# Patient Record
Sex: Female | Born: 1960 | Race: White | Hispanic: No | Marital: Married | Smoking: Never smoker
Health system: Southern US, Community
[De-identification: ages and names within clinical notes are randomized; demographics above are authoritative.]

## PROBLEM LIST (undated history)

## (undated) DIAGNOSIS — G4730 Sleep apnea, unspecified: Secondary | ICD-10-CM

## (undated) DIAGNOSIS — I10 Essential (primary) hypertension: Secondary | ICD-10-CM

## (undated) DIAGNOSIS — K5792 Diverticulitis of intestine, part unspecified, without perforation or abscess without bleeding: Secondary | ICD-10-CM

## (undated) DIAGNOSIS — E119 Type 2 diabetes mellitus without complications: Secondary | ICD-10-CM

## (undated) HISTORY — DX: Diverticulitis of intestine, part unspecified, without perforation or abscess without bleeding: K57.92

## (undated) HISTORY — PX: VAGINAL HYSTERECTOMY: SUR661

## (undated) HISTORY — DX: Sleep apnea, unspecified: G47.30

## (undated) HISTORY — DX: Essential (primary) hypertension: I10

---

## 2007-11-04 ENCOUNTER — Emergency Department: Payer: Self-pay | Admitting: Emergency Medicine

## 2008-10-22 HISTORY — PX: CARDIAC CATHETERIZATION: SHX172

## 2009-04-21 ENCOUNTER — Ambulatory Visit: Payer: Self-pay | Admitting: Family Medicine

## 2009-05-19 ENCOUNTER — Ambulatory Visit: Payer: Self-pay | Admitting: Cardiology

## 2009-11-12 ENCOUNTER — Emergency Department: Payer: Self-pay | Admitting: Emergency Medicine

## 2010-02-21 ENCOUNTER — Inpatient Hospital Stay: Payer: Self-pay | Admitting: Gastroenterology

## 2010-02-21 ENCOUNTER — Ambulatory Visit: Payer: Self-pay | Admitting: Gastroenterology

## 2010-06-19 ENCOUNTER — Emergency Department: Payer: Self-pay | Admitting: Emergency Medicine

## 2011-07-22 ENCOUNTER — Emergency Department: Payer: Self-pay | Admitting: Emergency Medicine

## 2012-07-08 ENCOUNTER — Emergency Department: Payer: Self-pay | Admitting: Emergency Medicine

## 2012-07-08 LAB — CBC
HCT: 40.5 % (ref 35.0–47.0)
HGB: 13.5 g/dL (ref 12.0–16.0)
MCHC: 33.2 g/dL (ref 32.0–36.0)
RBC: 4.48 10*6/uL (ref 3.80–5.20)
RDW: 13.2 % (ref 11.5–14.5)
WBC: 7.9 10*3/uL (ref 3.6–11.0)

## 2012-07-08 LAB — URINALYSIS, COMPLETE
Bacteria: NONE SEEN
Blood: NEGATIVE
Glucose,UR: NEGATIVE mg/dL (ref 0–75)
Ketone: NEGATIVE
Leukocyte Esterase: NEGATIVE
Nitrite: NEGATIVE

## 2012-07-08 LAB — DRUG SCREEN, URINE
Amphetamines, Ur Screen: NEGATIVE (ref ?–1000)
Barbiturates, Ur Screen: NEGATIVE (ref ?–200)
Cannabinoid 50 Ng, Ur ~~LOC~~: NEGATIVE (ref ?–50)
Cocaine Metabolite,Ur ~~LOC~~: NEGATIVE (ref ?–300)
MDMA (Ecstasy)Ur Screen: NEGATIVE (ref ?–500)
Opiate, Ur Screen: NEGATIVE (ref ?–300)
Phencyclidine (PCP) Ur S: NEGATIVE (ref ?–25)
Tricyclic, Ur Screen: NEGATIVE (ref ?–1000)

## 2012-07-08 LAB — COMPREHENSIVE METABOLIC PANEL
Albumin: 3.6 g/dL (ref 3.4–5.0)
Alkaline Phosphatase: 71 U/L (ref 50–136)
Anion Gap: 11 (ref 7–16)
BUN: 10 mg/dL (ref 7–18)
Calcium, Total: 8.8 mg/dL (ref 8.5–10.1)
EGFR (Non-African Amer.): >60
Glucose: 127 mg/dL — ABNORMAL HIGH (ref 65–99)
Osmolality: 284 (ref 275–301)
Potassium: 3.6 mmol/L (ref 3.5–5.1)
SGOT(AST): 20 U/L (ref 15–37)
SGPT (ALT): 30 U/L (ref 12–78)

## 2012-07-08 LAB — CK TOTAL AND CKMB (NOT AT ARMC): CK-MB: 2.1 ng/mL (ref 0.5–3.6)

## 2012-07-08 LAB — TROPONIN I: Troponin-I: <0.02 ng/mL

## 2012-07-08 LAB — TSH: Thyroid Stimulating Horm: 1.6 u[IU]/mL

## 2012-07-18 ENCOUNTER — Ambulatory Visit (INDEPENDENT_AMBULATORY_CARE_PROVIDER_SITE_OTHER): Payer: PRIVATE HEALTH INSURANCE | Admitting: Cardiovascular Disease

## 2012-07-18 ENCOUNTER — Encounter: Payer: Self-pay | Admitting: Cardiovascular Disease

## 2012-07-18 VITALS — BP 138/90 | HR 80 | Ht 63.0 in | Wt 206.8 lb

## 2012-07-18 DIAGNOSIS — R002 Palpitations: Secondary | ICD-10-CM | POA: Insufficient documentation

## 2012-07-18 DIAGNOSIS — R079 Chest pain, unspecified: Secondary | ICD-10-CM | POA: Insufficient documentation

## 2012-07-18 DIAGNOSIS — R000 Tachycardia, unspecified: Secondary | ICD-10-CM | POA: Insufficient documentation

## 2012-07-18 MED ORDER — ATENOLOL 100 MG PO TABS: 100.0000 mg | ORAL_TABLET | Freq: Two times a day (BID) | ORAL | Status: DC

## 2012-07-18 NOTE — Assessment & Plan Note (Signed)
Most of her symptoms seem to relate around her tachycardia at nighttime. Previous cardiac catheterization showing no significant CAD. No further workup at this time.

## 2012-07-18 NOTE — Assessment & Plan Note (Signed)
Suspect she has atrial tachycardia but only at nighttime. Unable to exclude other arrhythmia. We have suggested she start taking atenolol 50 mg in the evening in addition to her morning dose. We will slowly advance the evening dose as tolerated. If unable to improve her symptoms, we will do a Holter monitor at her request.

## 2012-07-18 NOTE — Patient Instructions (Addendum)
  Please increase the atenolol  Add extra 50 mg in the PM If you continue to have symptoms at night, take 50 mg in the AM and 100 mg in the PM You can increase the dose up to 100 mg twice a day  Please call us if you have new issues that need to be addressed before your next appt.  Your physician wants you to follow-up in: 1 months.

## 2012-07-18 NOTE — Assessment & Plan Note (Signed)
Uncertain if she has having ectopy, atrial tachycardia or other rhythm. We'll try medical management first. She denies any new stressors.

## 2012-07-18 NOTE — Progress Notes (Signed)
   Patient ID: April Estrada, female    DOB: [birth year], 51 y.o.   MRN: [id number]  HPI Comments: April Estrada is a very pleasant 51 year old woman with prior history of chest pain, abnormal stress test, cardiac catheterization showing no significant coronary artery disease in 2010 who has been maintained on atenolol for many years for blood pressure presents for evaluation of tachycardia and palpitations predominantly at nighttime.  She reports that her symptoms started one to 2 years ago. Symptoms every several times per week, predominantly at nighttime lasting less than 5 minutes at a time. She is woken up with a very strong fast heart rate. She typically does not do anything and it resolves on its own. Symptoms have been getting worse recently. Less during the daytime although this can happen, predominately at nighttime. She takes atenolol 100 mg in the morning. She denies any new stressors, any new medications. No other change in her health.  Cardiac catheterization 05/19/2009 shows normal coronary arteries. 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  She was seen in the emergency room for chest pain. EKG at that time showed left axis deviation, normal sinus rhythm with no other significant EKG changes. Chest x-ray was normal, cardiac enzymes normal, all other lab work normal  EKG today shows normal sinus rhythm with rate 80 beats per minute with left anterior fascicular block   Outpatient Encounter Prescriptions as of 07/18/2012  Medication Sig Dispense Refill  . Probiotic Product (PHILLIPS COLON HEALTH PO) Take by mouth daily.      Marland Kitchen atenolol (TENORMIN) 100 MG tablet Take 100 mg by mouth daily.        Review of Systems  Constitutional: Negative.   HENT: Negative.   Eyes: Negative.   Respiratory: Negative.   Cardiovascular: Positive for chest pain and palpitations.  Gastrointestinal: Negative.   Musculoskeletal: Negative.   Skin: Negative.   Neurological: Negative.     Hematological: Negative.   Psychiatric/Behavioral: Negative.   All other systems reviewed and are negative.    BP 138/90  Pulse 80  Ht 5\' 3"  (1.6 m)  Wt 206 lb 12 oz (93.781 kg)  BMI 36.62 kg/m2  Physical Exam  Nursing note and vitals reviewed. Constitutional: She is oriented to person, place, and time. She appears well-developed and well-nourished.  HENT:  Head: Normocephalic.  Nose: Nose normal.  Mouth/Throat: Oropharynx is clear and moist.  Eyes: Conjunctivae normal are normal. Pupils are equal, round, and reactive to light.  Neck: Normal range of motion. Neck supple. No JVD present.  Cardiovascular: Normal rate, regular rhythm, S1 normal, S2 normal and intact distal pulses.  Exam reveals no gallop and no friction rub.   Murmur heard.  Crescendo systolic murmur is present with a grade of 1/6  Pulmonary/Chest: Effort normal and breath sounds normal. No respiratory distress. She has no wheezes. She has no rales. She exhibits no tenderness.  Abdominal: Soft. Bowel sounds are normal. She exhibits no distension. There is no tenderness.  Musculoskeletal: Normal range of motion. She exhibits no edema and no tenderness.  Lymphadenopathy:    She has no cervical adenopathy.  Neurological: She is alert and oriented to person, place, and time. Coordination normal.  Skin: Skin is warm and dry. No rash noted. No erythema.  Psychiatric: She has a normal mood and affect. Her behavior is normal. Judgment and thought content normal.         Assessment and Plan

## 2012-08-27 ENCOUNTER — Encounter: Payer: Self-pay | Admitting: Cardiovascular Disease

## 2012-08-27 ENCOUNTER — Ambulatory Visit (INDEPENDENT_AMBULATORY_CARE_PROVIDER_SITE_OTHER): Payer: PRIVATE HEALTH INSURANCE | Admitting: Cardiovascular Disease

## 2012-08-27 VITALS — BP 120/80 | HR 69 | Ht 63.0 in | Wt 210.0 lb

## 2012-08-27 DIAGNOSIS — R079 Chest pain, unspecified: Secondary | ICD-10-CM

## 2012-08-27 DIAGNOSIS — R0602 Shortness of breath: Secondary | ICD-10-CM

## 2012-08-27 DIAGNOSIS — R000 Tachycardia, unspecified: Secondary | ICD-10-CM

## 2012-08-27 DIAGNOSIS — R609 Edema, unspecified: Secondary | ICD-10-CM

## 2012-08-27 DIAGNOSIS — E669 Obesity, unspecified: Secondary | ICD-10-CM | POA: Insufficient documentation

## 2012-08-27 MED ORDER — FUROSEMIDE 20 MG PO TABS: 20.0000 mg | ORAL_TABLET | Freq: Every day | ORAL | Status: DC

## 2012-08-27 MED ORDER — POTASSIUM CHLORIDE CRYS ER 20 MEQ PO TBCR: 20.0000 meq | EXTENDED_RELEASE_TABLET | Freq: Every day | ORAL | Status: DC

## 2012-08-27 NOTE — Progress Notes (Signed)
   Patient ID: April Estrada, female    DOB: [birth year], 51 y.o.   MRN: [id number]  HPI Comments: April Estrada is a very pleasant 51 year old woman with prior history of chest pain, abnormal stress test, cardiac catheterization showing no significant coronary artery disease in 2010 who has been maintained on atenolol for many years, seen on her last clinic visit for tachycardia and palpitations predominantly at nighttime. April Estrada presents for routine followup.  April Estrada increased her atenolol to 100 mg twice a day and this has improved her symptoms of tachycardia and palpitations. April Estrada is trying to lose weight, watching what April Estrada eats and walking more but still has not lost any weight. Some mild lower extremity edema. April Estrada has been drinking more fluids. Edema is worse at the end of the day. No improvement by wearing compression hose.  Cardiac catheterization 05/19/2009 shows normal coronary arteries. 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  Previously  seen in the emergency room for chest pain. EKG at that time showed left axis deviation, normal sinus rhythm with no other significant EKG changes. Chest x-ray was normal, cardiac enzymes normal, all other lab work normal  EKG today shows normal sinus rhythm with rate 69 beats per minute with left anterior fascicular block   Outpatient Encounter Prescriptions as of 08/27/2012  Medication Sig Dispense Refill  . atenolol (TENORMIN) 100 MG tablet Take 1 tablet (100 mg total) by mouth 2 (two) times daily.  180 tablet  3  . Probiotic Product (PHILLIPS COLON HEALTH PO) Take by mouth daily.        Review of Systems  Constitutional: Negative.   HENT: Negative.   Eyes: Negative.   Respiratory: Negative.   Cardiovascular: Positive for leg swelling.  Gastrointestinal: Negative.   Musculoskeletal: Negative.   Skin: Negative.   Neurological: Negative.   Hematological: Negative.   Psychiatric/Behavioral: Negative.   All other systems reviewed and are  negative.    BP 120/80  Pulse 69  Ht 5\' 3"  (1.6 m)  Wt 210 lb (95.255 kg)  BMI 37.20 kg/m2  Physical Exam  Nursing note and vitals reviewed. Constitutional: April Estrada is oriented to person, place, and time. April Estrada appears well-developed and well-nourished.  HENT:  Head: Normocephalic.  Nose: Nose normal.  Mouth/Throat: Oropharynx is clear and moist.  Eyes: Conjunctivae normal are normal. Pupils are equal, round, and reactive to light.  Neck: Normal range of motion. Neck supple. No JVD present.  Cardiovascular: Normal rate, regular rhythm, S1 normal, S2 normal and intact distal pulses.  Exam reveals no gallop and no friction rub.   Murmur heard.  Crescendo systolic murmur is present with a grade of 1/6  Pulmonary/Chest: Effort normal and breath sounds normal. No respiratory distress. April Estrada has no wheezes. April Estrada has no rales. April Estrada exhibits no tenderness.  Abdominal: Soft. Bowel sounds are normal. April Estrada exhibits no distension. There is no tenderness.  Musculoskeletal: Normal range of motion. April Estrada exhibits no edema and no tenderness.  Lymphadenopathy:    April Estrada has no cervical adenopathy.  Neurological: April Estrada is alert and oriented to person, place, and time. Coordination normal.  Skin: Skin is warm and dry. No rash noted. No erythema.  Psychiatric: April Estrada has a normal mood and affect. Her behavior is normal. Judgment and thought content normal.         Assessment and Plan

## 2012-08-27 NOTE — Patient Instructions (Addendum)
You are doing well. Please try lasix with potassium as needed for leg swelling  Please call us if you have new issues that need to be addressed before your next appt.  Your physician wants you to follow-up in: 12 months.  You will receive a reminder letter in the mail two months in advance. If you don't receive a letter, please call our office to schedule the follow-up appointment.

## 2012-08-27 NOTE — Assessment & Plan Note (Signed)
She is closely monitoring her diet, increasing her exercise. We have encouraged her to continue her current activities.

## 2012-08-27 NOTE — Assessment & Plan Note (Signed)
Minimal edema on exam. She is concerned about the severity of symptoms at the end of the day. Likely secondary to venous insufficiency, though unable to exclude mild diastolic dysfunction and fluid retention as she does trend a significant amount of fluids. We have offered Lasix with potassium when necessary. We have suggested if she takes this frequently, that she contact our office for lab work.

## 2012-08-27 NOTE — Assessment & Plan Note (Signed)
Palpitations and tachycardia has improved on atenolol 100 mg twice a day

## 2013-01-28 ENCOUNTER — Other Ambulatory Visit: Payer: Self-pay | Admitting: *Deleted

## 2013-01-28 MED ORDER — FUROSEMIDE 20 MG PO TABS: 20.0000 mg | ORAL_TABLET | Freq: Every day | ORAL | Status: DC

## 2013-01-28 MED ORDER — POTASSIUM CHLORIDE CRYS ER 20 MEQ PO TBCR: 20.0000 meq | EXTENDED_RELEASE_TABLET | Freq: Every day | ORAL | Status: DC

## 2013-01-28 NOTE — Telephone Encounter (Signed)
Refilled Furosemide and Potassium sent to [facility].

## 2013-03-23 ENCOUNTER — Emergency Department: Payer: Self-pay | Admitting: Emergency Medicine

## 2013-07-06 ENCOUNTER — Other Ambulatory Visit: Payer: Self-pay | Admitting: Cardiovascular Disease

## 2013-07-06 NOTE — Telephone Encounter (Signed)
Refilled Atenolol sent to [facility].

## 2013-09-02 ENCOUNTER — Encounter: Payer: Self-pay | Admitting: Cardiovascular Disease

## 2013-09-02 ENCOUNTER — Ambulatory Visit (INDEPENDENT_AMBULATORY_CARE_PROVIDER_SITE_OTHER): Payer: Commercial Managed Care - PPO | Admitting: Cardiovascular Disease

## 2013-09-02 VITALS — BP 120/80 | HR 83 | Ht 63.0 in | Wt 210.5 lb

## 2013-09-02 DIAGNOSIS — R000 Tachycardia, unspecified: Secondary | ICD-10-CM

## 2013-09-02 DIAGNOSIS — E669 Obesity, unspecified: Secondary | ICD-10-CM

## 2013-09-02 DIAGNOSIS — I498 Other specified cardiac arrhythmias: Secondary | ICD-10-CM

## 2013-09-02 DIAGNOSIS — I4902 Ventricular flutter: Secondary | ICD-10-CM

## 2013-09-02 MED ORDER — PROPRANOLOL HCL 20 MG PO TABS: 20.0000 mg | ORAL_TABLET | Freq: Three times a day (TID) | ORAL | Status: DC | PRN

## 2013-09-02 NOTE — Progress Notes (Signed)
Patient ID: April Estrada, female    DOB: May 02, 1961, 52 y.o.   MRN: [id number]  HPI Comments: [name] is a very pleasant 52 year old woman with prior history of chest pain, abnormal stress test, cardiac catheterization showing no significant coronary artery disease in 2010 who has been maintained on atenolol for many years, seen on previous clinic visits for tachycardia and palpitations predominantly at nighttime. She presents for routine followup. She is a CNA  She reports that she has had significant stress recently. Her husband has lost his job. She continues to have episodes of tachycardia at nighttime lasting for 15 minutes. He feels strong, fast. Roughly 4 times per week it will wake her. 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 She sleeps from 9 AM to 3 PM and a short nap before work she continues to take the Atenolol 100 mg twice a day . No significant symptoms of fatigue or dizziness . No significant edema on today's visit . She has reported large hemi-swelling in the past .  No improvement by wearing compression hose.  Cardiac catheterization 05/19/2009 shows normal coronary arteries. 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  Previously  seen in the emergency room for chest pain. EKG at that time showed left axis deviation, normal sinus rhythm with no other significant EKG changes. Chest x-ray was normal, cardiac enzymes normal, all other lab work normal  EKG today shows normal sinus rhythm with rate 83 beats per minute with left anterior fascicular block   Outpatient Encounter Prescriptions as of 09/02/2013  Medication Sig  . atenolol (TENORMIN) 100 MG tablet TAKE 1 TABLET (100 MG TOTAL) BY MOUTH 2 (TWO) TIMES DAILY.  . furosemide (LASIX) 20 MG tablet Take 1 tablet (20 mg total) by mouth daily.  . potassium chloride SA (K-DUR,KLOR-CON) 20 MEQ tablet Take 1 tablet (20 mEq total) by mouth daily.  . Probiotic Product (PHILLIPS COLON HEALTH PO) Take by mouth  daily.    Review of Systems  Constitutional: Negative.   HENT: Negative.   Eyes: Negative.   Respiratory: Negative.   Cardiovascular: Positive for palpitations.  Gastrointestinal: Negative.   Endocrine: Negative.   Musculoskeletal: Negative.   Skin: Negative.   Allergic/Immunologic: Negative.   Neurological: Negative.   Hematological: Negative.   Psychiatric/Behavioral: Negative.   All other systems reviewed and are negative.    BP 120/80  Pulse 83  Ht 5\' 3"  (1.6 m)  Wt 210 lb 8 oz (95.482 kg)  BMI 37.30 kg/m2  Physical Exam  Nursing note and vitals reviewed. Constitutional: She is oriented to person, place, and time. She appears well-developed and well-nourished.  HENT:  Head: Normocephalic.  Nose: Nose normal.  Mouth/Throat: Oropharynx is clear and moist.  Eyes: Conjunctivae are normal. Pupils are equal, round, and reactive to light.  Neck: Normal range of motion. Neck supple. No JVD present.  Cardiovascular: Normal rate, regular rhythm, S1 normal, S2 normal and intact distal pulses.  Exam reveals no gallop and no friction rub.   Murmur heard.  Crescendo systolic murmur is present with a grade of 1/6  Pulmonary/Chest: Effort normal and breath sounds normal. No respiratory distress. She has no wheezes. She has no rales. She exhibits no tenderness.  Abdominal: Soft. Bowel sounds are normal. She exhibits no distension. There is no tenderness.  Musculoskeletal: Normal range of motion. She exhibits no edema and no tenderness.  Lymphadenopathy:    She has no cervical adenopathy.  Neurological: She is alert and oriented  to person, place, and time. Coordination normal.  Skin: Skin is warm and dry. No rash noted. No erythema.  Psychiatric: She has a normal mood and affect. Her behavior is normal. Judgment and thought content normal.    Assessment and Plan

## 2013-09-02 NOTE — Patient Instructions (Signed)
For your fast heart rate, Try taking atenolol 2 pills at a time at 9 Am, If this does not work,  Take atenolol twice a day (Am and PM) and take propranolol before sleeping  Check out the smart phone aps:  Search pulse meter Cardiograph, instant heart rate  Please call us if you have new issues that need to be addressed before your next appt.  Your physician wants you to follow-up in: 6 months.  You will receive a reminder letter in the mail two months in advance. If you don't receive a letter, please call our office to schedule the follow-up appointment.

## 2013-09-02 NOTE — Assessment & Plan Note (Signed)
We have offered a Holter monitor. She would prefer to try medical management first. We have suggested she could try atenolol 200 mg prior to going to bed rather than 100 mg twice a day.  most of her symptoms are while sleeping. If that does not work, she could try atenolol 100 mg twice a day with extra propranolol before bed. She continues to have symptoms, we would order a Holter monitor. She does have significant stress at home, husband recently lost his job

## 2013-09-02 NOTE — Assessment & Plan Note (Signed)
We have encouraged continued exercise, careful diet management in an effort to lose weight. 

## 2014-01-05 IMAGING — CR DG CHEST 2V
1 series · 2 of 2 positions shown · non-contrast
Comparison: none

REASON FOR EXAM: Chest Pain
COMMENTS:

PROCEDURE:     DXR - DXR CHEST PA (OR AP) AND LATERAL  - July 08, 2012 [DATE]
RESULT:     Comparison: 11/12/2009

[Series 1: pa · 0.17mm/px · 2 of 2 slices shown]
[im 1/2]
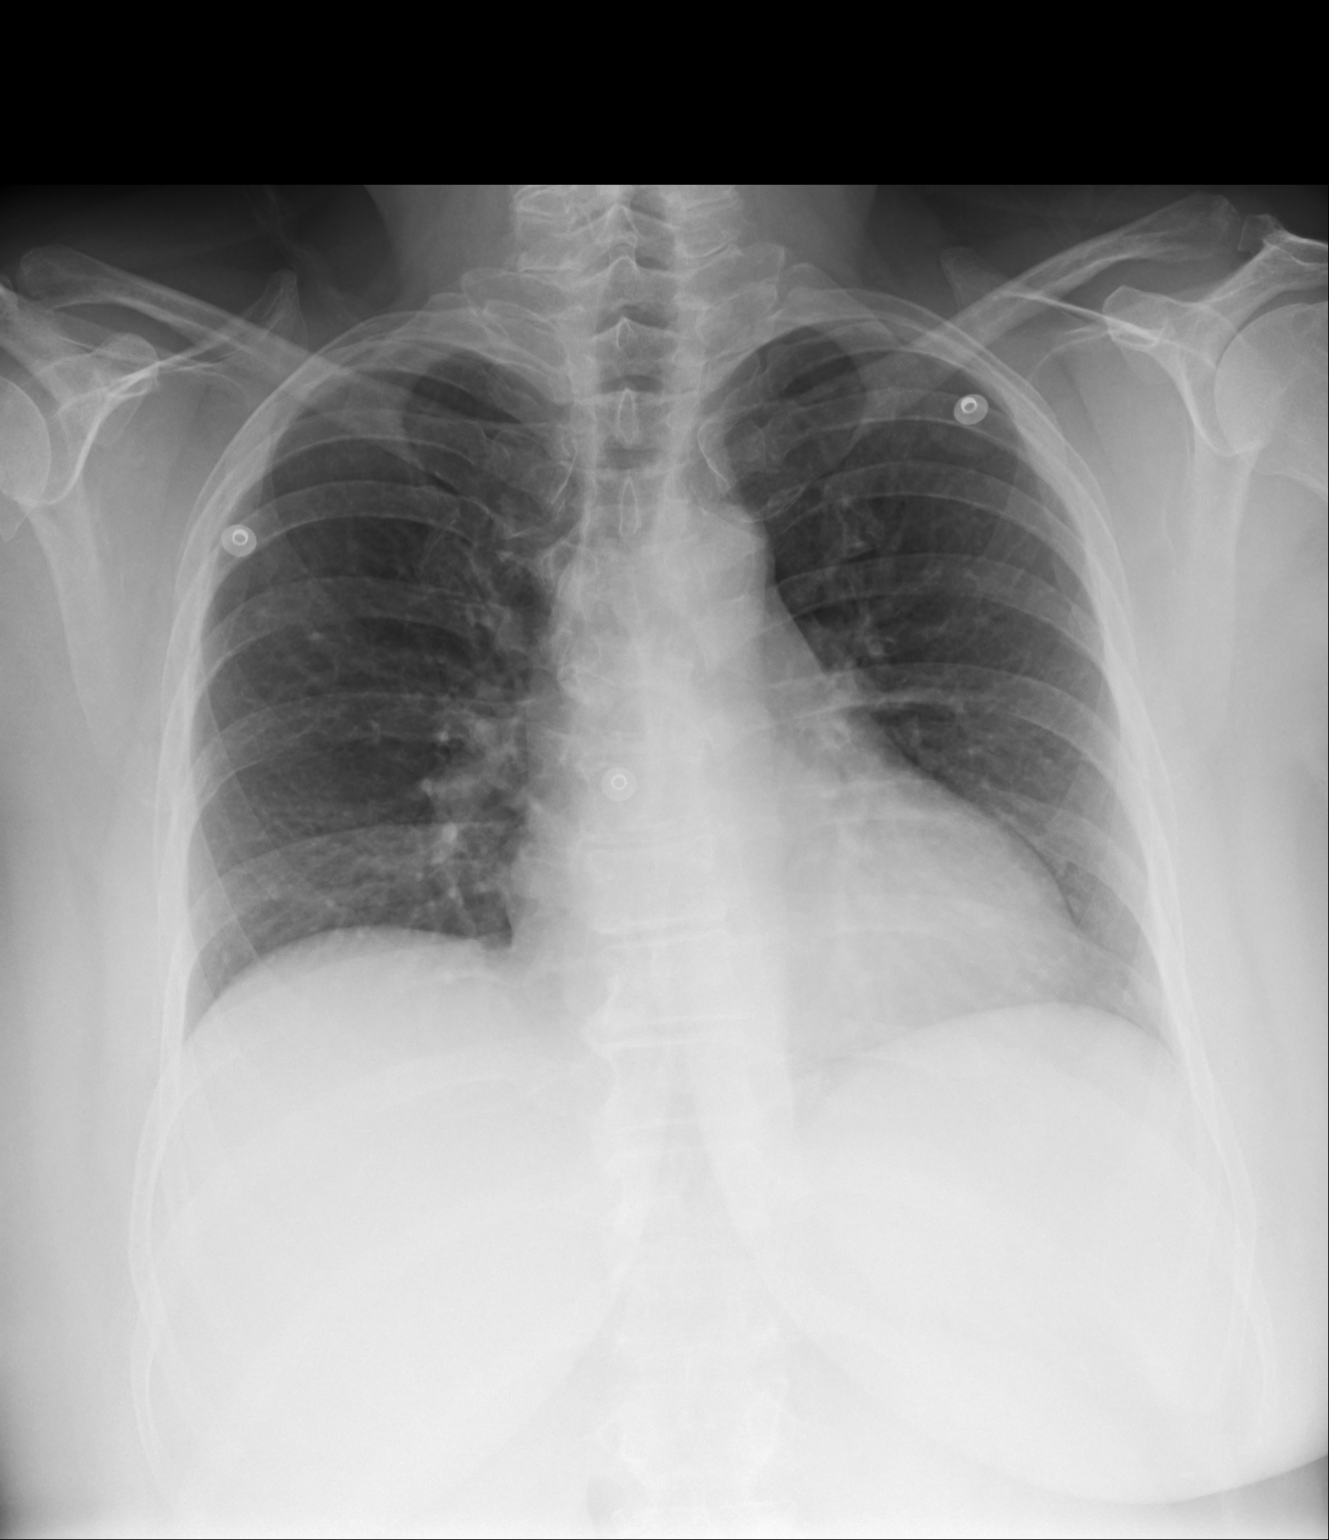
[im 2/2]
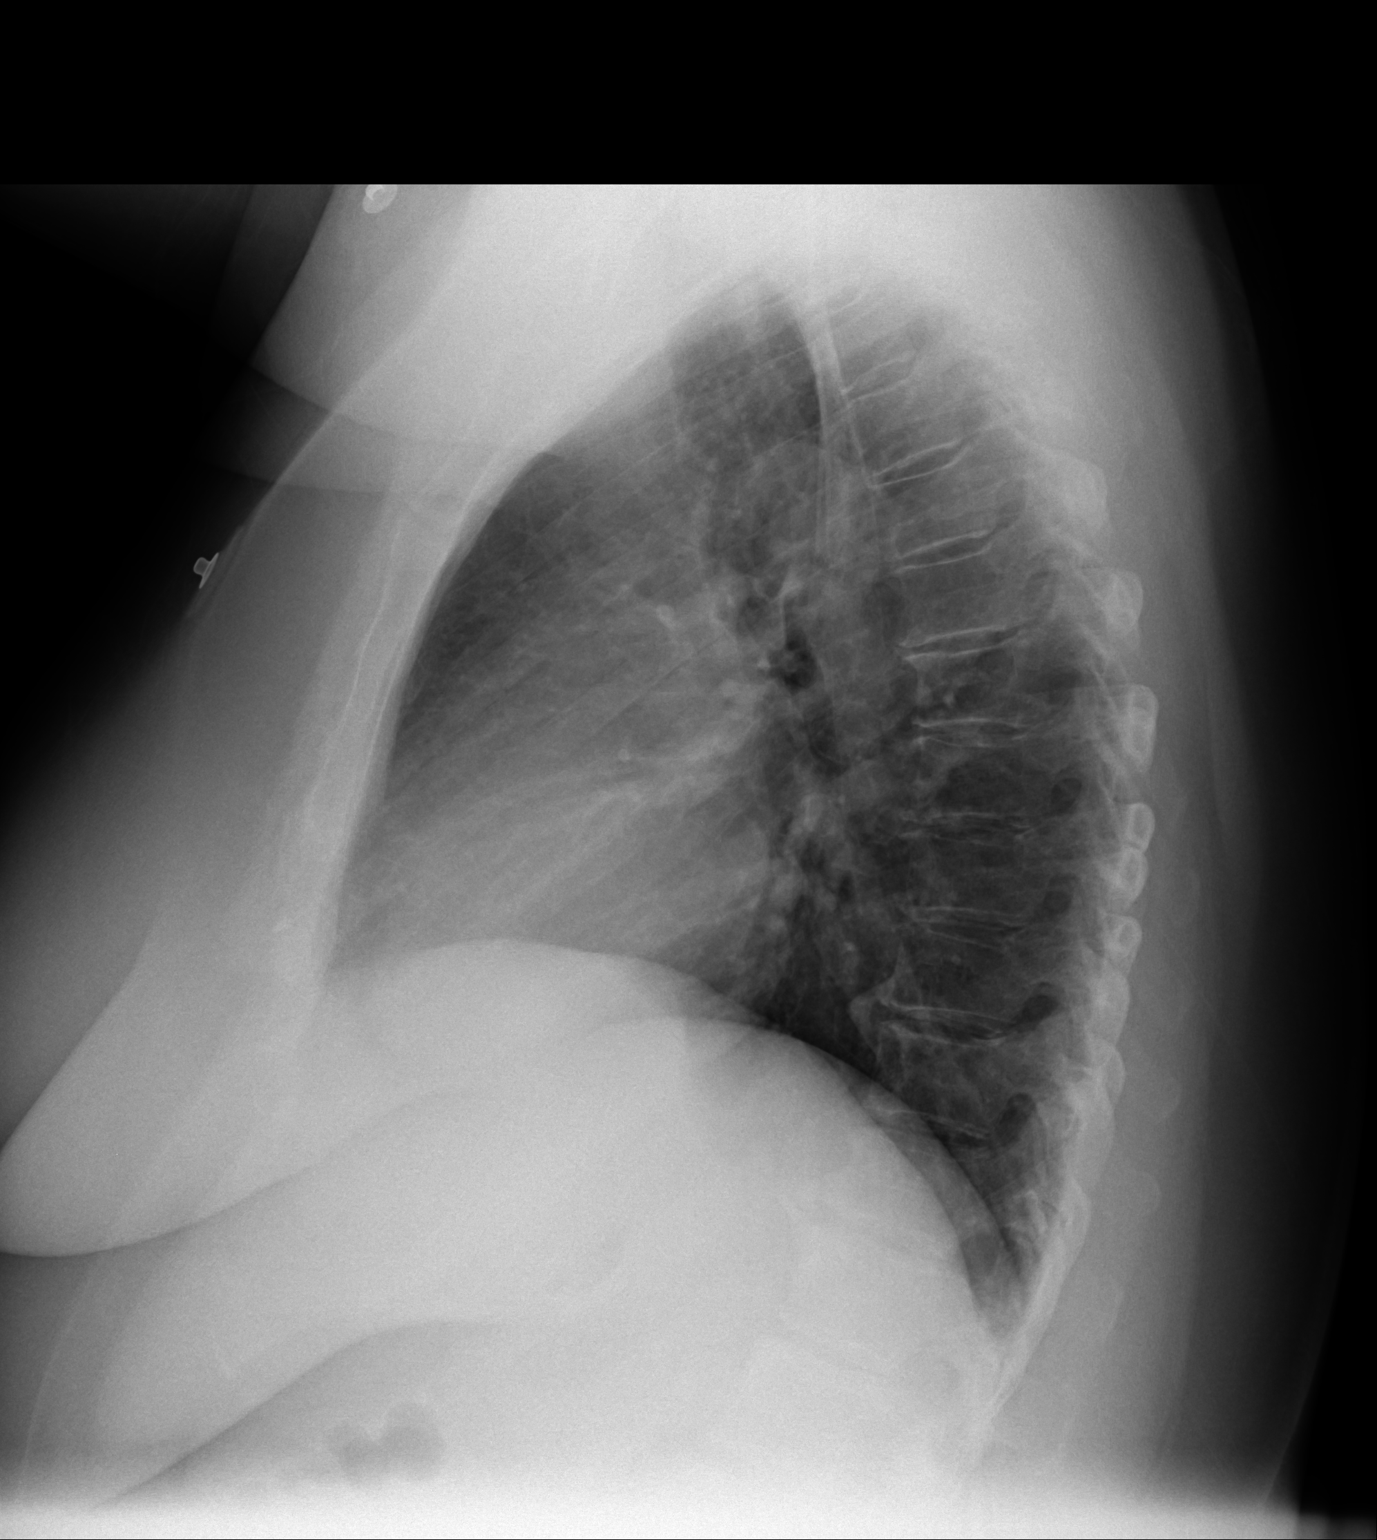

[2 of 2 positions shown; findings below may reference images not displayed]

FINDINGS: The lung volumes are low. Heart size upper limits of normal to mildly
enlarged, though this may be related to the low lung volumes. No focal
pulmonary opacities.
IMPRESSION: No acute cardiopulmonary disease.

[REDACTED]

## 2014-06-10 ENCOUNTER — Ambulatory Visit (INDEPENDENT_AMBULATORY_CARE_PROVIDER_SITE_OTHER): Payer: Commercial Managed Care - PPO | Admitting: Cardiovascular Disease

## 2014-06-10 ENCOUNTER — Encounter: Payer: Self-pay | Admitting: Cardiovascular Disease

## 2014-06-10 VITALS — BP 132/92 | HR 94 | Ht 63.0 in | Wt 210.0 lb

## 2014-06-10 DIAGNOSIS — E669 Obesity, unspecified: Secondary | ICD-10-CM

## 2014-06-10 DIAGNOSIS — E785 Hyperlipidemia, unspecified: Secondary | ICD-10-CM

## 2014-06-10 DIAGNOSIS — I498 Other specified cardiac arrhythmias: Secondary | ICD-10-CM

## 2014-06-10 DIAGNOSIS — I4902 Ventricular flutter: Secondary | ICD-10-CM

## 2014-06-10 DIAGNOSIS — R42 Dizziness and giddiness: Secondary | ICD-10-CM

## 2014-06-10 DIAGNOSIS — R000 Tachycardia, unspecified: Secondary | ICD-10-CM

## 2014-06-10 DIAGNOSIS — R609 Edema, unspecified: Secondary | ICD-10-CM

## 2014-06-10 MED ORDER — ATENOLOL 100 MG PO TABS
ORAL_TABLET | ORAL | Status: DC
Start: 2014-06-10 — End: 2015-06-29

## 2014-06-10 NOTE — Patient Instructions (Signed)
You are doing well. No medication changes were made.  Please take extra atenolol 1/2 pill as needed for tachycardia  We will do fasting lab work today  Please call us if you have new issues that need to be addressed before your next appt.  Your physician wants you to follow-up in: 12 months.  You will receive a reminder letter in the mail two months in advance. If you don't receive a letter, please call our office to schedule the follow-up appointment.

## 2014-06-10 NOTE — Progress Notes (Signed)
Patient ID: April Estrada, female    DOB: [birth year], 53 y.o.   MRN: [id number]  HPI Comments: [name] is a very pleasant 53 year old woman with prior history of chest pain, abnormal stress test, cardiac catheterization showing no significant coronary artery disease in 2010 who has been maintained on atenolol for many years, seen on previous clinic visits for tachycardia and palpitations predominantly at nighttime. She presents for routine followup. She is a CNA  In followup today, she reports that she continues to have occasional palpitations at nighttime. This typically presents once every 2 weeks. It lasts for 10 minutes. She denies having significant symptoms in the daytime. He is tolerating atenolol 100 mg twice a day. 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 No significant edema on today's visit .   Cardiac catheterization 05/19/2009 shows normal coronary arteries. 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  Previously  seen in the emergency room for chest pain. EKG at that time showed left axis deviation, normal sinus rhythm with no other significant EKG changes.  Chest x-ray was normal, cardiac enzymes normal, all other lab work normal  EKG today shows normal sinus rhythm with rate 94 beats per minute with left anterior fascicular block   Outpatient Encounter Prescriptions as of 06/10/2014  Medication Sig  . atenolol (TENORMIN) 100 MG tablet TAKE 1 TABLET (100 MG TOTAL) BY MOUTH 2 (TWO) TIMES DAILY.  . Probiotic Product (PHILLIPS COLON HEALTH PO) Take by mouth daily.  . propranolol (INDERAL) 20 MG tablet Take 1 tablet (20 mg total) by mouth 3 (three) times daily as needed.she has not been taking propranolol     Review of Systems  Constitutional: Negative.   HENT: Negative.   Eyes: Negative.    Respiratory: Negative.   Cardiovascular: Positive for palpitations.  Gastrointestinal: Negative.   Endocrine: Negative.   Musculoskeletal: Negative.   Skin: Negative.   Allergic/Immunologic: Negative.   Neurological: Negative.   Hematological: Negative.   Psychiatric/Behavioral: Negative.   All other systems reviewed and are negative.   BP 132/92  Pulse 94  Ht 5\' 3"  (1.6 m)  Wt 210 lb (95.255 kg)  BMI 37.21 kg/m2  Physical Exam  Nursing note and vitals reviewed. Constitutional: She is oriented to person, place, and time. She appears well-developed and well-nourished.  HENT:  Head: Normocephalic.  Nose: Nose normal.  Mouth/Throat: Oropharynx is clear and moist.  Eyes: Conjunctivae are normal. Pupils are equal, round, and reactive to light.  Neck: Normal range of motion. Neck supple. No JVD present.  Cardiovascular: Normal rate, regular rhythm, S1 normal, S2 normal and intact distal pulses.  Exam reveals no gallop and no friction rub.   Murmur heard.  Crescendo systolic murmur is present with a grade of 1/6  Pulmonary/Chest: Effort normal and breath sounds normal. No respiratory distress. She has no wheezes. She has no rales. She exhibits no tenderness.  Abdominal: Soft. Bowel sounds are normal. She exhibits no distension. There is no tenderness.  Musculoskeletal: Normal range of motion. She exhibits no edema and no tenderness.  Lymphadenopathy:    She has no cervical adenopathy.  Neurological: She is alert and oriented to person, place, and time. Coordination normal.  Skin: Skin is warm and dry. No rash noted. No erythema.  Psychiatric: She has a normal mood and affect. Her behavior is  normal. Judgment and thought content normal.    Assessment and Plan

## 2014-06-10 NOTE — Assessment & Plan Note (Addendum)
Rare episodes of tachycardia. She does not want to take propranolol on a regular basis in the evenings. She will stay on 100 mg twice a day. If frequency of tachycardia increases, we have suggested she change her atenolol to 150 mg evening, stay on 100 mg in the morning. We did offer a 2 day monitor for 30 day monitor. She declined at this time

## 2014-06-10 NOTE — Assessment & Plan Note (Signed)
We have encouraged continued exercise, careful diet management in an effort to lose weight. 

## 2014-06-10 NOTE — Assessment & Plan Note (Signed)
No significant leg swelling on today's visit

## 2014-06-11 LAB — HEPATIC FUNCTION PANEL
ALT: 24 IU/L (ref 0–32)
AST: 21 IU/L (ref 0–40)
Albumin: 4.4 g/dL (ref 3.5–5.5)
Alkaline Phosphatase: 55 IU/L (ref 39–117)
BILIRUBIN DIRECT: 0.1 mg/dL (ref 0.00–0.40)
BILIRUBIN TOTAL: 0.4 mg/dL (ref 0.0–1.2)
Total Protein: 6.9 g/dL (ref 6.0–8.5)

## 2014-06-11 LAB — LIPID PANEL
CHOL/HDL RATIO: 3 ratio (ref 0.0–4.4)
CHOLESTEROL TOTAL: 194 mg/dL (ref 100–199)
HDL: 64 mg/dL (ref >39)
LDL Calculated: 106 mg/dL — ABNORMAL HIGH (ref 0–99)
TRIGLYCERIDES: 122 mg/dL (ref 0–149)
VLDL Cholesterol Cal: 24 mg/dL (ref 5–40)

## 2014-12-02 ENCOUNTER — Ambulatory Visit: Payer: Self-pay | Admitting: Internal Medicine

## 2015-06-29 ENCOUNTER — Encounter: Payer: Self-pay | Admitting: Cardiovascular Disease

## 2015-06-29 ENCOUNTER — Ambulatory Visit (INDEPENDENT_AMBULATORY_CARE_PROVIDER_SITE_OTHER): Payer: Commercial Managed Care - PPO | Admitting: Cardiovascular Disease

## 2015-06-29 VITALS — BP 120/80 | HR 69 | Ht 63.0 in | Wt 206.8 lb

## 2015-06-29 DIAGNOSIS — R000 Tachycardia, unspecified: Secondary | ICD-10-CM

## 2015-06-29 DIAGNOSIS — E669 Obesity, unspecified: Secondary | ICD-10-CM

## 2015-06-29 DIAGNOSIS — R42 Dizziness and giddiness: Secondary | ICD-10-CM

## 2015-06-29 DIAGNOSIS — I4901 Ventricular fibrillation: Secondary | ICD-10-CM | POA: Diagnosis not present

## 2015-06-29 DIAGNOSIS — I498 Other specified cardiac arrhythmias: Secondary | ICD-10-CM

## 2015-06-29 DIAGNOSIS — R002 Palpitations: Secondary | ICD-10-CM

## 2015-06-29 DIAGNOSIS — R7303 Prediabetes: Secondary | ICD-10-CM

## 2015-06-29 DIAGNOSIS — R079 Chest pain, unspecified: Secondary | ICD-10-CM

## 2015-06-29 DIAGNOSIS — E785 Hyperlipidemia, unspecified: Secondary | ICD-10-CM

## 2015-06-29 DIAGNOSIS — R7309 Other abnormal glucose: Secondary | ICD-10-CM

## 2015-06-29 DIAGNOSIS — R609 Edema, unspecified: Secondary | ICD-10-CM

## 2015-06-29 MED ORDER — ATENOLOL 100 MG PO TABS: ORAL_TABLET | ORAL | Status: DC

## 2015-06-29 NOTE — Assessment & Plan Note (Signed)
Currently with no symptoms of chest discomfort. No further workup at this time  no family history of coronary artery disease. We did discuss coronary calcium scoring if she is interested in risk stratification

## 2015-06-29 NOTE — Patient Instructions (Addendum)
You are doing well. No medication changes were made.  Please call us if you have new issues that need to be addressed before your next appt.  Your physician wants you to follow-up in: 12 months.  You will receive a reminder letter in the mail two months in advance. If you don't receive a letter, please call our office to schedule the follow-up appointment. 

## 2015-06-29 NOTE — Assessment & Plan Note (Signed)
We have encouraged continued exercise, careful diet management in an effort to lose weight. 

## 2015-06-29 NOTE — Assessment & Plan Note (Signed)
Well-controlled symptoms on current dose of beta blocker No medication changes made

## 2015-06-29 NOTE — Assessment & Plan Note (Signed)
Rare episodes of fluttering. We have recommended if symptoms get worse that she call our office for a monitor

## 2015-06-29 NOTE — Assessment & Plan Note (Signed)
She reports recent sugar levels were elevated Details unclear. We will request the lab report Recommended weight loss, dietary guide provided and discussed with her

## 2015-06-29 NOTE — Progress Notes (Signed)
Patient ID: April Estrada [facility], female    DOB: [birth year], 54 y.o.   MRN: [id number]  HPI Comments: [name] is a very pleasant 54 year old woman with prior history of chest pain, abnormal stress test, cardiac catheterization showing no significant coronary artery disease in 2010 who has been maintained on atenolol for many years, seen on previous clinic visits for tachycardia and palpitations predominantly at nighttime. She presents for routine followup of her palpitations She is a CNA  In follow up today, she was diagnosed with sleep apnea, currently wearing CPAP. Palpitations have improved at nighttime. She is also changing to a daytime shift, going to work with hospice. This starts early next week. She is very excited. She continues to have some palpitations described as a fluttering lasting typically less than 30 or 45 seconds. Rare episodes Doing well on atenolol 100 mg twice a day  EKG on today's visit shows no sinus rhythm with no significant ST or T-wave changes, left anterior fascicular block  Other past medical history Cardiac catheterization 05/19/2009 shows normal coronary arteries. 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  Previously  seen in the emergency room for chest pain. EKG at that time showed left axis deviation, normal sinus rhythm with no other significant EKG changes.  Chest x-ray was normal, cardiac enzymes normal, all other lab work normal    Allergies  Allergen Reactions  . Codeine     Sick on stomach  Rash      Medication List       atenolol 100 MG tablet  Commonly known as:  TENORMIN  TAKE 1 TABLET (100 MG TOTAL) BY MOUTH 2 (TWO) TIMES DAILY.     citalopram 20 MG tablet  Commonly known as:  CELEXA  Take 20 mg by mouth daily.     PHILLIPS COLON HEALTH PO  Take by mouth daily.     propranolol 20 MG tablet  Commonly known as:  INDERAL  Take 1 tablet (20 mg total) by mouth 3 (three) times daily as needed.        Past Medical  History  Diagnosis Date  . Hypertension   . Diverticulitis   . Sleep apnea     Past Surgical History  Procedure Laterality Date  . Vaginal hysterectomy    . Cesarean section    . Cardiac catheterization  2010    [facility]    Social History  reports that she has never smoked. She does not have any smokeless tobacco history on file. She reports that she does not drink alcohol or use illicit drugs.  Family History family history includes Heart attack in her paternal grandfather; Heart disease in her paternal grandmother; Hyperlipidemia in her mother, paternal grandfather, and paternal grandmother; Hypertension in her mother, paternal grandfather, and paternal grandmother.   Review of Systems  Constitutional: Negative.   Respiratory: Negative.   Cardiovascular: Positive for palpitations.  Gastrointestinal: Negative.   Musculoskeletal: Negative.   Skin: Negative.   Neurological: Negative.   Hematological: Negative.   Psychiatric/Behavioral: Negative.   All other systems reviewed and are negative.   BP 120/80 mmHg  Pulse 69  Ht 5\' 3"  (1.6 m)  Wt 206 lb 12 oz (93.781 kg)  BMI 36.63 kg/m2  Physical Exam  Constitutional: She is oriented to person, place, and time. She appears well-developed and well-nourished.  HENT:  Head: Normocephalic.  Nose: Nose normal.  Mouth/Throat: Oropharynx is clear and moist.  Eyes: Conjunctivae are normal. Pupils are equal, round, and reactive  to light.  Neck: Normal range of motion. Neck supple. No JVD present.  Cardiovascular: Normal rate, regular rhythm, S1 normal, S2 normal and intact distal pulses.  Exam reveals no gallop and no friction rub.   Murmur heard.  Crescendo systolic murmur is present with a grade of 1/6  Pulmonary/Chest: Effort normal and breath sounds normal. No respiratory distress. She has no wheezes. She has no rales. She exhibits no tenderness.  Abdominal: Soft. Bowel sounds are normal. She exhibits no distension. There is no  tenderness.  Musculoskeletal: Normal range of motion. She exhibits no edema or tenderness.  Lymphadenopathy:    She has no cervical adenopathy.  Neurological: She is alert and oriented to person, place, and time. Coordination normal.  Skin: Skin is warm and dry. No rash noted. No erythema.  Psychiatric: She has a normal mood and affect. Her behavior is normal. Judgment and thought content normal.    Assessment and Plan  Nursing note and vitals reviewed.

## 2015-06-29 NOTE — Assessment & Plan Note (Signed)
Leg edema has resolved. No further medication changes

## 2015-07-08 ENCOUNTER — Other Ambulatory Visit: Payer: Self-pay | Admitting: Cardiovascular Disease

## 2016-07-27 ENCOUNTER — Encounter: Payer: Self-pay | Admitting: Cardiovascular Disease

## 2016-07-27 ENCOUNTER — Ambulatory Visit (INDEPENDENT_AMBULATORY_CARE_PROVIDER_SITE_OTHER): Payer: Managed Care, Other (non HMO) | Admitting: Cardiovascular Disease

## 2016-07-27 VITALS — BP 144/88 | HR 78 | Ht 63.0 in | Wt 216.0 lb

## 2016-07-27 DIAGNOSIS — R000 Tachycardia, unspecified: Secondary | ICD-10-CM | POA: Diagnosis not present

## 2016-07-27 DIAGNOSIS — I159 Secondary hypertension, unspecified: Secondary | ICD-10-CM | POA: Diagnosis not present

## 2016-07-27 DIAGNOSIS — R002 Palpitations: Secondary | ICD-10-CM | POA: Diagnosis not present

## 2016-07-27 DIAGNOSIS — R609 Edema, unspecified: Secondary | ICD-10-CM | POA: Diagnosis not present

## 2016-07-27 DIAGNOSIS — E669 Obesity, unspecified: Secondary | ICD-10-CM

## 2016-07-27 NOTE — Progress Notes (Signed)
Cardiology Office Note  Date:  07/27/2016   ID:  April Estrada, DOB 27-Jul-1961, MRN [id number]  PCP:  Margaretann LovelessKHAN,NEELAM S, MD   Chief Complaint  Patient presents with  . othe r    12 month follow up. Meds reviewed by the pt. verbally. "doing well."     HPI:  April Estrada is a very pleasant 55 year old woman with prior history of chest pain, abnormal stress test, cardiac catheterization showing no significant coronary artery disease in 2010 who has been maintained on atenolol for many years, seen on previous clinic visits for tachycardia and palpitations predominantly at nighttime. She presents for routine followup of her palpitations She is a CNA  In follow-up, she reports that she is doing well Currently working 2 jobs, Overall feels that she is in a "good place", happy with work Seems to the working hard with less stress Goes to gym, uses the treadmill, disappointed she has not had weight loss  Given lack of palpitation symptoms, she has decreased atenolol down to daily  Also weaned off her Celexa   Denies any new symptoms, no shortness of breath, no chest pain, no leg swelling  EKG on today's visit shows normal sinus rhythm with rate 79 bpm, left axis deviation   Other past medical history Cardiac catheterization 05/19/2009 shows normal coronary arteries. 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  Previously  seen in the emergency room for chest pain. EKG at that time showed left axis deviation, normal sinus rhythm with no other significant EKG changes.  Chest x-ray was normal, cardiac enzymes normal, all other lab work normal   PMH:   has a past medical history of Diverticulitis; Hypertension; and Sleep apnea.  PSH:    Past Surgical History:  Procedure Laterality Date  . CARDIAC CATHETERIZATION  2010   [facility]  . CESAREAN SECTION    . VAGINAL HYSTERECTOMY      Current Outpatient Prescriptions  Medication Sig Dispense Refill  . atenolol (TENORMIN) 100 MG  tablet Take 100 mg by mouth daily.    . Probiotic Product (PHILLIPS COLON HEALTH PO) Take by mouth daily.    . propranolol (INDERAL) 20 MG tablet Take 1 tablet (20 mg total) by mouth 3 (three) times daily as needed. 90 tablet 3   No current facility-administered medications for this visit.      Allergies:   Codeine   Social History:  The patient  reports that she has never smoked. She has never used smokeless tobacco. She reports that she does not drink alcohol or use drugs.   Family History:   family history includes Heart attack in her paternal grandfather; Heart disease in her paternal grandmother; Hyperlipidemia in her mother, paternal grandfather, and paternal grandmother; Hypertension in her mother, paternal grandfather, and paternal grandmother.    Review of Systems: Review of Systems  Constitutional: Negative.   Respiratory: Negative.   Cardiovascular: Negative.   Gastrointestinal: Negative.   Musculoskeletal: Negative.   Neurological: Negative.   Psychiatric/Behavioral: Negative.   All other systems reviewed and are negative.    PHYSICAL EXAM: VS:  BP (!) 144/88 (BP Location: Left Arm, Patient Position: Sitting, Cuff Size: Normal)   Pulse 78   Ht 5\' 3"  (1.6 m)   Wt 216 lb (98 kg)   BMI 38.26 kg/m  , BMI Body mass index is 38.26 kg/m. GEN: Well nourished, well developed, in no acute distress, obese  HEENT: normal  Neck: no JVD, carotid bruits, or masses Cardiac: RRR; no  murmurs, rubs, or gallops,no edema  Respiratory:  clear to auscultation bilaterally, normal work of breathing GI: soft, nontender, nondistended, + BS MS: no deformity or atrophy  Skin: warm and dry, no rash Neuro:  Strength and sensation are intact Psych: euthymic mood, full affect    Recent Labs: No results found for requested labs within last 8760 hours.    Lipid Panel Lab Results  Component Value Date   CHOL 194 06/10/2014   HDL 64 06/10/2014   LDLCALC 106 (H) 06/10/2014   TRIG 122  06/10/2014      Wt Readings from Last 3 Encounters:  07/27/16 216 lb (98 kg)  06/29/15 206 lb 12 oz (93.8 kg)  06/10/14 210 lb (95.3 kg)       ASSESSMENT AND PLAN:  Secondary hypertension - Plan: EKG 12-Lead Blood pressure borderline elevated on today's visit. Recommended she monitor blood pressure at home and call our office if this continues to run high  Tachycardia No significant symptoms on atenolol 100 mg daily Suggested she take extra atenolol as needed for breakthrough palpitations  Edema, unspecified type No significant leg swelling noted  Obesity without serious comorbidity, unspecified classification, unspecified obesity type We have encouraged continued exercise, careful diet management in an effort to lose weight.   Total encounter time more than 15 minutes  Greater than 50% was spent in counseling and coordination of care with the patient   Disposition:   F/U  12 months prn   Orders Placed This Encounter  Procedures  . EKG 12-Lead     Signed, Dossie Arbour, M.D., Ph.D. 07/27/2016  [facility] [place], [place] [phone]

## 2016-07-27 NOTE — Patient Instructions (Signed)

## 2016-09-10 ENCOUNTER — Other Ambulatory Visit: Payer: Self-pay | Admitting: Cardiovascular Disease

## 2016-09-10 DIAGNOSIS — I498 Other specified cardiac arrhythmias: Secondary | ICD-10-CM

## 2016-09-10 DIAGNOSIS — R42 Dizziness and giddiness: Secondary | ICD-10-CM

## 2016-09-10 DIAGNOSIS — E785 Hyperlipidemia, unspecified: Secondary | ICD-10-CM

## 2016-09-10 NOTE — Telephone Encounter (Signed)
Please advise if ok to refill. Tried to contact patient, phone is off with no vm. Pt requesting refill for atenolol 100mg  BID. Last ov says 100 mg daily. Thanks!

## 2016-11-03 ENCOUNTER — Emergency Department
Admission: EM | Admit: 2016-11-03 | Discharge: 2016-11-03 | Disposition: A | Discharge status: Home / Self Care | Payer: Managed Care, Other (non HMO) | Attending: Emergency Medicine | Admitting: Emergency Medicine

## 2016-11-03 ENCOUNTER — Emergency Department: Payer: Managed Care, Other (non HMO)

## 2016-11-03 ENCOUNTER — Encounter: Payer: Self-pay | Admitting: Urgent Care

## 2016-11-03 DIAGNOSIS — J111 Influenza due to unidentified influenza virus with other respiratory manifestations: Secondary | ICD-10-CM

## 2016-11-03 DIAGNOSIS — Z79899 Other long term (current) drug therapy: Secondary | ICD-10-CM | POA: Diagnosis not present

## 2016-11-03 DIAGNOSIS — I10 Essential (primary) hypertension: Secondary | ICD-10-CM | POA: Insufficient documentation

## 2016-11-03 DIAGNOSIS — R05 Cough: Secondary | ICD-10-CM | POA: Diagnosis present

## 2016-11-03 LAB — INFLUENZA PANEL BY PCR (TYPE A & B)
Influenza A By PCR: POSITIVE — AB
Influenza B By PCR: NEGATIVE

## 2016-11-03 MED ORDER — OSELTAMIVIR PHOSPHATE 75 MG PO CAPS: 75.0000 mg | ORAL_CAPSULE | Freq: Two times a day (BID) | ORAL | 0 refills | Status: DC

## 2016-11-03 MED ORDER — PSEUDOEPH-BROMPHEN-DM 30-2-10 MG/5ML PO SYRP: 10.0000 mL | ORAL_SOLUTION | Freq: Four times a day (QID) | ORAL | 0 refills | Status: DC | PRN

## 2016-11-03 MED ORDER — SODIUM CHLORIDE 0.9 % IV BOLUS (SEPSIS)
1000.0000 mL | Freq: Once | INTRAVENOUS | Status: AC
  Administered 2016-11-03: 1000 mL via INTRAVENOUS

## 2016-11-03 MED ORDER — ALBUTEROL SULFATE HFA 108 (90 BASE) MCG/ACT IN AERS: 1.0000 | INHALATION_SPRAY | Freq: Four times a day (QID) | RESPIRATORY_TRACT | 0 refills | Status: DC | PRN

## 2016-11-03 MED ORDER — FLUTICASONE PROPIONATE 50 MCG/ACT NA SUSP: 2.0000 | Freq: Every day | NASAL | 0 refills | Status: DC

## 2016-11-03 NOTE — Discharge Instructions (Signed)
Rest. Push fluids. Take medications as prescribed.  Follow-up with your primary care provider in 3 days if no better.

## 2016-11-03 NOTE — ED Provider Notes (Signed)
[facility] Emergency Department Provider Note  ____________________________________________  Time seen: Approximately 8:29 PM  I have reviewed the triage vital signs and the nursing notes.   HISTORY  Chief Complaint Influenza    HPI April Estrada is a 56 y.o. female , NAD, presents to [facility] with sudden onset flulike symptoms. Patient states that she had onset of cough, chest congestion, fevers, chills and body aches as of last night. 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 Patient states she has had no abdominal pain, nausea or vomiting but states she has chronic diarrhea with no change in her chronic status. Denies any blood or mucus in stool. Patient has had no chest pain, shortness of breath or wheezing. Has had nasal congestion, runny nose and sinus pressure.    Past Medical History:  Diagnosis Date  . Diverticulitis   . Hypertension   . Sleep apnea     Patient Active Problem List   Diagnosis Date Noted  . Prediabetes 06/29/2015  . Edema 08/27/2012  . Obese 08/27/2012  . Palpitation 07/18/2012  . Tachycardia 07/18/2012  . Chest pain 07/18/2012    Past Surgical History:  Procedure Laterality Date  . CARDIAC CATHETERIZATION  2010   [facility]  . CESAREAN SECTION    . VAGINAL HYSTERECTOMY      Prior to Admission medications   Medication Sig Start Date End Date Taking? Authorizing Provider  albuterol (PROVENTIL HFA;VENTOLIN HFA) 108 (90 Base) MCG/ACT inhaler Inhale 1-2 puffs into the lungs every 6 (six) hours as needed for wheezing or shortness of breath. 11/03/16   Tyshika Baldridge L Mael Delap, PA-C  atenolol (TENORMIN) 100 MG tablet Take 100 mg by mouth daily.    Historical Provider, MD  atenolol (TENORMIN) 100 MG tablet TAKE 1 TABLET (100 MG TOTAL) BY MOUTH 2 (TWO) TIMES DAILY. 09/10/16   Antonieta Iba, MD  brompheniramine-pseudoephedrine-DM 30-2-10 MG/5ML syrup Take 10 mLs by mouth 4 (four)  times daily as needed. 11/03/16   Janay Canan L Aleksis Jiggetts, PA-C  fluticasone (FLONASE) 50 MCG/ACT nasal spray Place 2 sprays into both nostrils daily. 11/03/16   Orvel Cutsforth L Debbra Digiulio, PA-C  oseltamivir (TAMIFLU) 75 MG capsule Take 1 capsule (75 mg total) by mouth 2 (two) times daily. 11/03/16   Tifanie Gardiner L Japneet Staggs, PA-C  Probiotic Product (PHILLIPS COLON HEALTH PO) Take by mouth daily.    Historical Provider, MD  propranolol (INDERAL) 20 MG tablet Take 1 tablet (20 mg total) by mouth 3 (three) times daily as needed. 09/02/13   Antonieta Iba, MD    Allergies Codeine  Family History  Problem Relation Age of Onset  . Hypertension Mother   . Hyperlipidemia Mother   . Hypertension Paternal Grandmother   . Hyperlipidemia Paternal Grandmother   . Heart disease Paternal Grandmother   . Heart attack Paternal Grandfather   . Hyperlipidemia Paternal Grandfather   . Hypertension Paternal Grandfather     Social History Social History  Substance Use Topics  . Smoking status: Never Smoker  . Smokeless tobacco: Never Used  . Alcohol use No     Review of Systems  Constitutional: Positive fever, chills, fatigue. ENT: Positive nasal congestion, runny nose, sinus pressure, ear pressure. No sore throat. Cardiovascular: No chest pain. Respiratory: Positive cough, chest congestion. No shortness of breath. No wheezing.  Gastrointestinal: Positive chronic diarrhea. No abdominal pain.  No nausea, vomiting.  Musculoskeletal: Positive for general myalgias.  Skin: Negative for  rash. Neurological: Positive for headaches, but none focal weakness or numbness. 10-point ROS otherwise negative.  ____________________________________________   PHYSICAL EXAM:  VITAL SIGNS: ED Triage Vitals [11/03/16 1928]  Enc Vitals Group     BP      Pulse Rate (!) 126     Resp (!) 22     Temp 99.2 F (37.3 C)     Temp Source Oral     SpO2 97 %     Weight 211 lb (95.7 kg)     Height 5\' 3"  (1.6 m)     Head Circumference      Peak  Flow      Pain Score 7     Pain Loc      Pain Edu?      Excl. in GC?      Constitutional: Alert and oriented. Ill appearing but in no acute distress. Eyes: Conjunctivae are normal.  Head: Atraumatic. ENT:      Ears: TMs his eyes bilaterally with mild serous effusion but no bulging, erythema or perforation.      Nose: Her congestion with profuse clear rhinorrhea. Turbinates are injected bilaterally.      Mouth/Throat: Mucous membranes are moist. Pharynx without erythema, swelling, NAD. Clear postnasal drip. Uvula is midline. Airway is patent. Neck: Supple with full range of motion. Hematological/Lymphatic/Immunilogical: No cervical lymphadenopathy. Cardiovascular: Normal rate, regular rhythm. Normal S1 and S2.  Good peripheral circulation. Respiratory: Normal respiratory effort without tachypnea or retractions. Lungs with coarse breath sounds throughout but no wheezes, rhonchi or rales. Neurologic:  Normal speech and language. No gross focal neurologic deficits are appreciated.  Skin:  Skin is warm, dry and intact. No rash noted. Psychiatric: Mood and affect are normal. Speech and behavior are normal. Patient exhibits appropriate insight and judgement.   ____________________________________________   LABS (all labs ordered are listed, but only abnormal results are displayed)  Labs Reviewed  INFLUENZA PANEL BY PCR (TYPE A & B, H1N1) - Abnormal; Notable for the following:       Result Value   Influenza A By PCR POSITIVE (*)    All other components within normal limits   ____________________________________________  EKG  None ____________________________________________  RADIOLOGY I, Ernestene Kiel Meshelle Holness, personally viewed and evaluated these images (plain radiographs) as part of my medical decision making, as well as reviewing the written report by the radiologist.  Dg Chest 2 View  Result Date: 11/03/2016 CLINICAL DATA:  Flu like symptoms starting this morning. EXAM: CHEST  2  VIEW COMPARISON:  07/08/2012 FINDINGS: The lungs are clear wiithout focal pneumonia, edema, pneumothorax or pleural effusion. Interstitial markings are diffusely coarsened with chronic features. The cardiopericardial silhouette is within normal limits for size. The visualized bony structures of the thorax are intact. IMPRESSION: No active cardiopulmonary disease. Electronically Signed   By: Kennith Center M.D.   On: 11/03/2016 21:12    ____________________________________________    PROCEDURES  Procedure(s) performed: None   Procedures   Medications  sodium chloride 0.9 % bolus 1,000 mL (0 mLs Intravenous Stopped 11/03/16 2147)     ____________________________________________   INITIAL IMPRESSION / ASSESSMENT AND PLAN / ED COURSE  Pertinent labs & imaging results that were available during my care of the patient were reviewed by me and considered in my medical decision making (see chart for details).  Clinical Course     Patient's diagnosis is consistent with Influenza. Patient's chest x-ray was negative for any acute cardiopulmonary abnormality. Patient was given IV fluids due  to her history of chronic diarrhea and recent fever. Patient tolerated well. Patient will be discharged home with prescriptions for Tamiflu, Flonase, Bromfed-DM and albuterol inhaler to use as directed. Patient is to [facility] her primary care provider [facility] if symptoms persist past this treatment course. Patient is given ED precautions to return to the ED for any worsening or new symptoms.    ____________________________________________  FINAL CLINICAL IMPRESSION(S) / ED DIAGNOSES  Final diagnoses:  Influenza      NEW MEDICATIONS STARTED DURING THIS VISIT:  Discharge Medication List as of 11/03/2016  9:37 PM    START taking these medications   Details  albuterol (PROVENTIL HFA;VENTOLIN HFA) 108 (90 Base) MCG/ACT inhaler Inhale 1-2 puffs into the lungs every 6 (six) hours  as needed for wheezing or shortness of breath., Starting Sat 11/03/2016, Print    brompheniramine-pseudoephedrine-DM 30-2-10 MG/5ML syrup Take 10 mLs by mouth 4 (four) times daily as needed., Starting Sat 11/03/2016, Print    fluticasone (FLONASE) 50 MCG/ACT nasal spray Place 2 sprays into both nostrils daily., Starting Sat 11/03/2016, Print    oseltamivir (TAMIFLU) 75 MG capsule Take 1 capsule (75 mg total) by mouth 2 (two) times daily., Starting Sat 11/03/2016, Print             Ernestene KielJami L Leona ValleyHagler, PA-C 11/04/16 0008    Myrna Blazeravid Matthew Schaevitz, MD 11/04/16 [phone]

## 2016-11-03 NOTE — ED Notes (Signed)
Pt reports flu sx x 1 day, reports headache, back ache, cough, nasal congestion, and fever.  Pt NAD at this time.

## 2016-11-03 NOTE — ED Triage Notes (Addendum)
Patient presents to the ED with c/o influenza like symptoms that started last night; worsened throughout the day. (+) NP cough, headache, myalgias, and fever (tmax 100). 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 Presents TACHYcardic to the 120s; patient forcefully coughing and has a low grade temp; antipyretics taken just PTA.

## 2017-10-29 NOTE — Progress Notes (Signed)
Cardiology Office Note  Date:  10/30/2017   ID:  April Estrada, DOB 10-03-1961, MRN [id number]  PCP:  Margaretann Loveless, MD   Chief Complaint  Patient presents with  . OTHER    12 month f/u c/o chest discomfort with heavy lifting, numbness and tingling in hands/feet. Meds reviewed verbally with pt.    HPI:  [name] is a very pleasant 57 year old woman with prior history of  chest pain, abnormal stress test,  cardiac catheterization showing no significant coronary artery disease in 2010  who has been maintained on atenolol for many years,  tachycardia and palpitations predominantly at nighttime.  She presents for routine followup of her palpitations She is a CNA  Numbess, tingling,  Right hand numbness leg anterior thigh  Some regular exercise walks 10 times a week with her dog Weight trending up Currently working 2 jobs, hospice and [employer]  happy with work Seems to the working hard with less stress Divorce last year, husband was not helping to support her, did not have a job  Denies having any palpitations Wonders if she can come off the atenolol  Occasional wine use  "why do I have a blood pressure problem"  Previously weaned off her Celexa  Denies any new symptoms, no shortness of breath, no chest pain, no leg swelling  EKG on today's visit shows normal sinus rhythm with rate 69 bpm, left axis deviation  Other past medical history Cardiac catheterization 05/19/2009 shows normal coronary arteries. 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  Previously  seen in the emergency room for chest pain. EKG at that time showed left axis deviation, normal sinus rhythm with no other significant EKG changes.  Chest x-ray was normal, cardiac enzymes normal, all other lab work normal   PMH:   has a past medical history of Diverticulitis, Hypertension, and Sleep apnea.  PSH:    Past Surgical History:  Procedure Laterality Date  . CARDIAC CATHETERIZATION  2010    [facility]  . CESAREAN SECTION    . VAGINAL HYSTERECTOMY      Current Outpatient Medications  Medication Sig Dispense Refill  . atenolol (TENORMIN) 100 MG tablet Take 100 mg by mouth daily.     No current facility-administered medications for this visit.      Allergies:   Codeine   Social History:  The patient  reports that  has never smoked. she has never used smokeless tobacco. She reports that she does not drink alcohol or use drugs.   Family History:   family history includes Heart attack in her paternal grandfather; Heart disease in her paternal grandmother; Hyperlipidemia in her mother, paternal grandfather, and paternal grandmother; Hypertension in her mother, paternal grandfather, and paternal grandmother.    Review of Systems: Review of Systems  Constitutional: Negative.   Respiratory: Negative.   Cardiovascular: Negative.   Gastrointestinal: Negative.   Musculoskeletal: Negative.   Neurological: Positive for tingling.       Hand tingling on the right worse than the left, numbness right anterior thigh  Psychiatric/Behavioral: Negative.   All other systems reviewed and are negative.    PHYSICAL EXAM: VS:  BP 124/76 (BP Location: Left Arm, Patient Position: Sitting, Cuff Size: Large)   Pulse 69   Ht 5\' 3"  (1.6 m)   Wt 231 lb 8 oz (105 kg)   BMI 41.01 kg/m  , BMI Body mass index is 41.01 kg/m.  No significant change in exam compared to previous office visit GEN:  Well nourished, well developed, in no acute distress, obese  HEENT: normal  Neck: no JVD, carotid bruits, or masses Cardiac: RRR; no murmurs, rubs, or gallops,no edema  Respiratory:  clear to auscultation bilaterally, normal work of breathing GI: soft, nontender, nondistended, + BS MS: no deformity or atrophy  Skin: warm and dry, no rash Neuro:  Strength and sensation are intact Psych: euthymic mood, full affect    Recent Labs: No results found for requested labs within last 8760 hours.    Lipid  Panel Lab Results  Component Value Date   CHOL 194 06/10/2014   HDL 64 06/10/2014   LDLCALC 106 (H) 06/10/2014   TRIG 122 06/10/2014      Wt Readings from Last 3 Encounters:  10/30/17 231 lb 8 oz (105 kg)  11/03/16 211 lb (95.7 kg)  07/27/16 216 lb (98 kg)       ASSESSMENT AND PLAN:  Secondary hypertension - Plan: EKG 12-Lead Blood pressure stable, she would like to wean down on the atenolol Recommend she cut in half first and monitor pressures We can wean down as tolerated Atenolol previously prescribed for palpitations  Tachycardia No significant symptoms on atenolol 100 mg daily She would like to wean down as tolerated  Edema, unspecified type No significant leg swelling noted Recommended compression hose if she gets significant swelling  Obesity without serious comorbidity, unspecified classification, unspecified obesity type We have encouraged continued exercise, careful diet management in an effort to lose weight.  Walking her dog 10 times per week  Thigh numbness Possible anterior femoral cutaneous nerve entrapment Recommended weight loss, NSAIDs   discussed doing lab work, she will call us if she would like certain labs done    Total encounter time more than 25 minutes  Greater than 50% was spent in counseling and coordination of care with the patient   Disposition:   F/U  12 months prn   Orders Placed This Encounter  Procedures  . EKG 12-Lead     Signed, Dossie Arbourim Adnan Vanvoorhis, M.D., Ph.D. 10/30/2017  [facility] [place] [phone]

## 2017-10-30 ENCOUNTER — Ambulatory Visit (INDEPENDENT_AMBULATORY_CARE_PROVIDER_SITE_OTHER): Payer: Managed Care, Other (non HMO) | Admitting: Cardiovascular Disease

## 2017-10-30 ENCOUNTER — Encounter: Payer: Self-pay | Admitting: Cardiovascular Disease

## 2017-10-30 VITALS — BP 124/76 | HR 69 | Ht 63.0 in | Wt 231.5 lb

## 2017-10-30 DIAGNOSIS — R079 Chest pain, unspecified: Secondary | ICD-10-CM | POA: Diagnosis not present

## 2017-10-30 DIAGNOSIS — R000 Tachycardia, unspecified: Secondary | ICD-10-CM

## 2017-10-30 DIAGNOSIS — R7303 Prediabetes: Secondary | ICD-10-CM

## 2017-10-30 DIAGNOSIS — R609 Edema, unspecified: Secondary | ICD-10-CM

## 2017-10-30 DIAGNOSIS — R002 Palpitations: Secondary | ICD-10-CM | POA: Diagnosis not present

## 2017-10-30 NOTE — Patient Instructions (Addendum)
Medication Instructions:    Consider weaning the atenolol  Labwork:  None at this time  Testing/Procedures:  No further testing at this time   Follow-Up: It was a pleasure seeing you in the office today. Please call us if you have new issues that need to be addressed before your next appt.  [phone]  Your physician wants you to follow-up in: 12 month as needed.  You will receive a reminder letter in the mail two months in advance. If you don't receive a letter, please call our office to schedule the follow-up appointment.  If you need a refill on your cardiac medications before your next appointment, please call your pharmacy.

## 2017-11-02 ENCOUNTER — Other Ambulatory Visit: Payer: Self-pay | Admitting: Cardiovascular Disease

## 2017-11-04 ENCOUNTER — Other Ambulatory Visit: Payer: Self-pay

## 2017-11-04 NOTE — Telephone Encounter (Signed)
Please review & contact patient per Dr. Windell HummingbirdGollan's last office visit regarding weaning off the Atenolol.

## 2017-11-05 ENCOUNTER — Telehealth: Payer: Self-pay | Admitting: Cardiovascular Disease

## 2017-11-05 MED ORDER — ATENOLOL 100 MG PO TABS: 100.0000 mg | ORAL_TABLET | Freq: Every day | ORAL | 3 refills | Status: DC

## 2017-11-05 NOTE — Telephone Encounter (Signed)
Spoke with patient and she said that previously she was taking 200 mg once daily and that during her discussion with Dr. Mariah MillingGollan they discussed she should try to wean down on the atenolol. She reports that she has gotten it down to the 100 mg once daily and that has been working for her. She reports that she was out of medication over the weekend and her heart rate has been elevated. Reviewed with her that Dr. Mariah MillingGollan did discuss her decreasing the medication and trying to wean but she states that she has weaned it down to 100 mg once daily. Reviewed that she should continue monitoring her blood pressures and if she continues to have problems to give us a call back. She verbalized understanding of our conversation, agreement with plan, and had no further questions at this time.

## 2017-11-05 NOTE — Telephone Encounter (Signed)
°*  STAT* If patient is at the pharmacy, call can be transferred to refill team.   1. Which medications need to be refilled? (please list name of each medication and dose if known) Atenolol 100 mg po daily per epic ( patient states she was told to start weaning off   2. Which pharmacy/location (including street and city if local pharmacy) is medication to be sent to? [facility] [address] [place]   3. Do they need a 30 day or 90 day supply? 90  PATIENT HAS BEEN OUT SINCE Saturday and her heart rate is starting to go up

## 2017-11-05 NOTE — Telephone Encounter (Signed)
Please advise how the instructions should read.  Per Dr. Windell HummingbirdGollan's last note  "Blood pressure stable, she would like to wean down on the atenolol Recommend she cut in half first and monitor pressures"

## 2017-12-12 NOTE — Telephone Encounter (Signed)
Please advise proper dosage and instructions for refill on Atenolol due to pt weaning off atenolol last OV note.

## 2017-12-16 MED ORDER — ATENOLOL 50 MG PO TABS: 100.0000 mg | ORAL_TABLET | Freq: Two times a day (BID) | ORAL | 2 refills | Status: DC

## 2017-12-16 NOTE — Addendum Note (Signed)
Addended by: Eustace MooreANDERSON, Latreece Mochizuki M on: 12/16/2017 02:38 PM   Modules accepted: Orders

## 2018-11-17 ENCOUNTER — Telehealth: Payer: Self-pay | Admitting: Cardiovascular Disease

## 2018-11-17 NOTE — Telephone Encounter (Signed)
STAT if patient feels like he/she is going to faint   1) Are you dizzy now? Yes for 2 hrs   2) Do you feel faint or have you passed out?  No   3) Do you have any other symptoms? Light headed L arm aching and numb   4) Have you checked your HR and BP (record if available)?  No

## 2018-11-17 NOTE — Telephone Encounter (Signed)
Left a message for the patient to call back.  

## 2018-11-19 NOTE — Telephone Encounter (Signed)
Attempted to call patient. LMTCB 1/29 

## 2018-11-26 NOTE — Telephone Encounter (Signed)
Attempted to call the patient.  I left a message asking that she still call back if she needed to speak with [place]. Will close this encounter.

## 2019-02-06 ENCOUNTER — Telehealth: Payer: Self-pay

## 2019-02-06 NOTE — Telephone Encounter (Signed)
Virtual Visit Pre-Appointment Phone Call  Steps For Call:  1. Confirm consent - "In the setting of the current Covid19 crisis, you are scheduled for a video visit with your provider on Mar 09, 2019 at 8:40AM.  Just as we do with many in-office visits, in order for you to participate in this visit, we must obtain consent.  If you'd like, I can send this to your mychart (if signed up) or email for you to review.  Otherwise, I can obtain your verbal consent now.  All virtual visits are billed to your insurance company just like a normal visit would be.  By agreeing to a virtual visit, we'd like you to understand that the technology does not allow for your provider to perform an examination, and thus may limit your provider's ability to fully assess your condition. If your provider identifies any concerns that need to be evaluated in person, we will make arrangements to do so.  Finally, though the technology is pretty good, we cannot assure that it will always work on either your or our end, and in the setting of a video visit, we may have to convert it to a phone-only visit.  In either situation, we cannot ensure that we have a secure connection.  Are you willing to proceed?" STAFF: Did the patient verbally acknowledge consent to telehealth visit? Document YES/NO here: YES  2. Confirm the BEST phone number to call the day of the visit by including in appointment notes  3. Give patient instructions for WebEx/MyChart download to smartphone as below or Doximity/Doxy.me if video visit (depending on what platform provider is using)  4. Advise patient to be prepared with their blood pressure, heart rate, weight, any heart rhythm information, their current medicines, and a piece of paper and pen handy for any instructions they may receive the day of their visit  5. Inform patient they will receive a phone call 15 minutes prior to their appointment time (may be from unknown caller ID) so they should be  prepared to answer  6. Confirm that appointment type is correct in Epic appointment notes (VIDEO vs PHONE)     TELEPHONE CALL NOTE  April Estrada has been deemed a candidate for a follow-up tele-health visit to limit community exposure during the Covid-19 pandemic. I spoke with the patient via phone to ensure availability of phone/video source, confirm preferred email & phone number, and discuss instructions and expectations.  I reminded April Estrada to be prepared with any vital sign and/or heart rhythm information that could potentially be obtained via home monitoring, at the time of her visit. I reminded April Estrada to expect a phone call at the time of her visit if her visit.  Tommie Sams McClain 02/06/2019 11:28 AM   INSTRUCTIONS FOR DOWNLOADING THE WEBEX APP TO SMARTPHONE  - If Apple, ask patient to go to [employer] and type in WebEx in the search bar. Download [employer], the blue/green circle. If Android, go to [employer] and type in [employer] in the search bar. The app is free but as with any other app downloads, their phone may require them to verify saved payment information or Apple/Android password.  - The patient does NOT have to create an account. - On the day of the visit, the assist will walk the patient through joining the meeting with the meeting number/password.  INSTRUCTIONS FOR DOWNLOADING THE MYCHART APP TO SMARTPHONE  - The patient must first  make sure to have activated MyChart and know their login information - If Apple, go to [employer] and type in MyChart in the search bar and download the app. If Android, ask patient to go to [employer] and type in [place] in the search bar and download the app. The app is free but as with any other app downloads, their phone may require them to verify saved payment information or Apple/Android password.  - The patient will need to then log into the app with their MyChart username and password, and  select [facility] as their healthcare provider to link the account. When it is time for your visit, go to the MyChart app, find appointments, and click Begin Video Visit. Be sure to Select Allow for your device to access the Microphone and Camera for your visit. You will then be connected, and your provider will be with you shortly.  **If they have any issues connecting, or need assistance please contact MyChart service desk [phone])**  **If using a computer, in order to ensure the best quality for their visit they will need to use either of the following Internet Browsers: [employer], or Google Chrome**  IF USING DOXIMITY or DOXY.ME - The patient will receive a link just prior to their visit, either by text or email (to be determined day of appointment depending on if it's doxy.me or Doximity).     FULL LENGTH CONSENT FOR TELE-HEALTH VISIT   I hereby voluntarily request, consent and authorize [facility] and its employed or contracted physicians, physician assistants, nurse practitioners or other licensed health care professionals (the Practitioner), to provide me with telemedicine health care services (the "Services") as deemed necessary by the treating Practitioner. I acknowledge and consent to receive the Services by the Practitioner via telemedicine. I understand that the telemedicine visit will involve communicating with the Practitioner through live audiovisual communication technology and the disclosure of certain medical information by electronic transmission. I acknowledge that I have been given the opportunity to request an in-person assessment or other available alternative prior to the telemedicine visit and am voluntarily participating in the telemedicine visit.  I understand that I have the right to withhold or withdraw my consent to the use of telemedicine in the course of my care at any time, without affecting my right to future care or treatment, and that  the Practitioner or I may terminate the telemedicine visit at any time. I understand that I have the right to inspect all information obtained and/or recorded in the course of the telemedicine visit and may receive copies of available information for a reasonable fee.  I understand that some of the potential risks of receiving the Services via telemedicine include:  Marland Kitchen Delay or interruption in medical evaluation due to technological equipment failure or disruption; . Information transmitted may not be sufficient (e.g. poor resolution of images) to allow for appropriate medical decision making by the Practitioner; and/or  . In rare instances, security protocols could fail, causing a breach of personal health information.  Furthermore, I acknowledge that it is my responsibility to provide information about my medical history, conditions and care that is complete and accurate to the best of my ability. I acknowledge that Practitioner's advice, recommendations, and/or decision may be based on factors not within their control, such as incomplete or inaccurate data provided by me or distortions of diagnostic images or specimens that may result from electronic transmissions. I understand that the practice of medicine is not an  exact science and that Practitioner makes no warranties or guarantees regarding treatment outcomes. I acknowledge that I will receive a copy of this consent concurrently upon execution via email to the email address I last provided but may also request a printed copy by calling the office of [facility].    I understand that my insurance will be billed for this visit.   I have read or had this consent read to me. . I understand the contents of this consent, which adequately explains the benefits and risks of the Services being provided via telemedicine.  . I have been provided ample opportunity to ask questions regarding this consent and the Services and have had my questions answered to  my satisfaction. . I give my informed consent for the services to be provided through the use of telemedicine in my medical care  By participating in this telemedicine visit I agree to the above.

## 2019-03-04 ENCOUNTER — Telehealth: Payer: Self-pay | Admitting: *Deleted

## 2019-03-04 DIAGNOSIS — Z20822 Contact with and (suspected) exposure to covid-19: Secondary | ICD-10-CM

## 2019-03-04 NOTE — Telephone Encounter (Signed)
Received a call from [facility] with this pt's name to be scheduled for COVID-19 testing.  I scheduled her at the [facility] building in [place] at 8:15 AM on 03/05/2019.

## 2019-03-05 ENCOUNTER — Other Ambulatory Visit: Payer: Self-pay

## 2019-03-05 DIAGNOSIS — Z20822 Contact with and (suspected) exposure to covid-19: Secondary | ICD-10-CM

## 2019-03-09 ENCOUNTER — Telehealth: Payer: Self-pay | Admitting: Cardiovascular Disease

## 2019-03-09 LAB — NOVEL CORONAVIRUS, NAA: SARS-CoV-2, NAA: NOT DETECTED

## 2019-03-12 ENCOUNTER — Telehealth: Payer: Self-pay | Admitting: Internal Medicine

## 2019-03-12 NOTE — Telephone Encounter (Signed)
Pt given COVID-19 result Negative. Pt verbalized understanding.

## 2019-03-15 ENCOUNTER — Other Ambulatory Visit: Payer: Self-pay | Admitting: Cardiovascular Disease

## 2019-03-17 NOTE — Telephone Encounter (Signed)
Called patient.  No answer. LMOV.  Need to verify medication dose/Instructions

## 2019-03-17 NOTE — Telephone Encounter (Signed)
Patient returned call.  Patient confirmed she is currently taking Atenolol 100MG  1 tablet twice a day.  Will refill this dose with instructions.

## 2019-10-07 ENCOUNTER — Encounter: Payer: Self-pay | Admitting: Emergency Medicine

## 2019-10-07 ENCOUNTER — Other Ambulatory Visit: Payer: Self-pay

## 2019-10-07 ENCOUNTER — Emergency Department
Admission: EM | Admit: 2019-10-07 | Discharge: 2019-10-07 | Disposition: A | Discharge status: Home / Self Care | Payer: Self-pay | Attending: Emergency Medicine | Admitting: Emergency Medicine

## 2019-10-07 ENCOUNTER — Emergency Department: Payer: Self-pay

## 2019-10-07 DIAGNOSIS — N390 Urinary tract infection, site not specified: Secondary | ICD-10-CM | POA: Insufficient documentation

## 2019-10-07 DIAGNOSIS — I10 Essential (primary) hypertension: Secondary | ICD-10-CM | POA: Insufficient documentation

## 2019-10-07 DIAGNOSIS — Z79899 Other long term (current) drug therapy: Secondary | ICD-10-CM | POA: Insufficient documentation

## 2019-10-07 LAB — URINALYSIS, COMPLETE (UACMP) WITH MICROSCOPIC
Bacteria, UA: NONE SEEN
Bilirubin Urine: NEGATIVE
Glucose, UA: NEGATIVE mg/dL
Ketones, ur: NEGATIVE mg/dL
Nitrite: NEGATIVE
Protein, ur: 30 mg/dL — AB
RBC / HPF: >50 RBC/hpf — ABNORMAL HIGH (ref 0–5)
Specific Gravity, Urine: 1.017 (ref 1.005–1.030)
WBC, UA: >50 WBC/hpf — ABNORMAL HIGH (ref 0–5)
pH: 7 (ref 5.0–8.0)

## 2019-10-07 MED ORDER — PHENAZOPYRIDINE HCL 95 MG PO TABS: 95.0000 mg | ORAL_TABLET | Freq: Three times a day (TID) | ORAL | 0 refills | Status: AC | PRN

## 2019-10-07 MED ORDER — CEFTRIAXONE SODIUM 1 G IJ SOLR
1.0000 g | Freq: Once | INTRAMUSCULAR | Status: AC
  Administered 2019-10-07: 1 g via INTRAMUSCULAR
  Filled 2019-10-07: qty 10

## 2019-10-07 MED ORDER — CEPHALEXIN 500 MG PO CAPS: 500.0000 mg | ORAL_CAPSULE | Freq: Four times a day (QID) | ORAL | 0 refills | Status: DC

## 2019-10-07 MED ORDER — CEPHALEXIN 500 MG PO CAPS
500.0000 mg | ORAL_CAPSULE | Freq: Once | ORAL | Status: AC
  Administered 2019-10-07: 19:00:00 500 mg via ORAL
  Filled 2019-10-07: qty 1

## 2019-10-07 MED ORDER — LIDOCAINE HCL (PF) 1 % IJ SOLN
INTRAMUSCULAR | Status: AC
  Filled 2019-10-07: qty 5

## 2019-10-07 NOTE — ED Notes (Signed)
Patient presents to the ED with lower back pain x 4 days, urinary urgency and halting urination today with hematuria.  Patient also reports 1 episode of urinary incontinence today as well as dysuria.

## 2019-10-07 NOTE — ED Triage Notes (Signed)
Says urinary urgnecy for 2 days with mid back pain.  Does not think fever, but not sure.

## 2019-10-07 NOTE — ED Provider Notes (Signed)
[facility] Emergency Department Provider Note  ____________________________________________  Time seen: Approximately 4:48 PM  I have reviewed the triage vital signs and the nursing notes.   HISTORY  Chief Complaint Back Pain and Urinary Frequency    HPI April Estrada is a 58 y.o. female who presents the emergency department complaining of low back pain, suprapubic pain, dysuria, polyuria, urinary urgency.  Patient states that she has had increasing low back pain as well as suprapubic pain, cramping sensation in the suprapubic region as well as pink-tinged urine.  No history of nephrolithiasis.  No fevers or chills.  No GI complaints.  Patient does not have a significant history of recurrent UTIs.  History of diverticulitis, hypertension and sleep apnea.  No complaints of chronic medical problems.  No nausea, vomiting, diarrhea, constipation, hematochezia.         Past Medical History:  Diagnosis Date  . Diverticulitis   . Hypertension   . Sleep apnea     Patient Active Problem List   Diagnosis Date Noted  . Prediabetes 06/29/2015  . Edema 08/27/2012  . Obese 08/27/2012  . Palpitation 07/18/2012  . Tachycardia 07/18/2012  . Chest pain 07/18/2012    Past Surgical History:  Procedure Laterality Date  . CARDIAC CATHETERIZATION  2010   [facility]  . CESAREAN SECTION    . VAGINAL HYSTERECTOMY      Prior to Admission medications   Medication Sig Start Date End Date Taking? Authorizing Provider  atenolol (TENORMIN) 100 MG tablet Take 1 tablet (100 mg total) by mouth daily. 11/05/17   Minna Merritts, MD  atenolol (TENORMIN) 100 MG tablet Take 1 tablet (100 mg total) by mouth 2 (two) times daily. 03/17/19   Minna Merritts, MD  cephALEXin (KEFLEX) 500 MG capsule Take 1 capsule (500 mg total) by mouth 4 (four) times daily. 10/07/19   Kyra Laffey, Charline Bills, PA-C  phenazopyridine (PYRIDIUM) 95 MG tablet Take 1 tablet (95 mg total) by mouth 3 (three) times  daily as needed for pain. 10/07/19   Jamira Barfuss, Charline Bills, PA-C    Allergies Codeine  Family History  Problem Relation Age of Onset  . Hypertension Mother   . Hyperlipidemia Mother   . Hypertension Paternal Grandmother   . Hyperlipidemia Paternal Grandmother   . Heart disease Paternal Grandmother   . Heart attack Paternal Grandfather   . Hyperlipidemia Paternal Grandfather   . Hypertension Paternal Grandfather     Social History Social History   Tobacco Use  . Smoking status: Never Smoker  . Smokeless tobacco: Never Used  Substance Use Topics  . Alcohol use: No  . Drug use: No     Review of Systems  Constitutional: No fever/chills Eyes: No visual changes. No discharge ENT: No upper respiratory complaints. Cardiovascular: no chest pain. Respiratory: no cough. No SOB. Gastrointestinal: No abdominal pain.  No nausea, no vomiting.  No diarrhea.  No constipation. Genitourinary: Positive for dysuria, polyuria, hematuria with urinary urgency.  Positive low back pain/flank pain Musculoskeletal: Negative for musculoskeletal pain. Skin: Negative for rash, abrasions, lacerations, ecchymosis. Neurological: Negative for headaches, focal weakness or numbness. 10-point ROS otherwise negative.  ____________________________________________   PHYSICAL EXAM:  VITAL SIGNS: ED Triage Vitals  Enc Vitals Group     BP 10/07/19 1623 (!) 205/99     Pulse Rate 10/07/19 1623 84     Resp 10/07/19 1623 14     Temp 10/07/19 1623 98.7 F (37.1 C)     Temp Source 10/07/19  1623 Oral     SpO2 10/07/19 1623 95 %     Weight 10/07/19 1624 240 lb (108.9 kg)     Height 10/07/19 1624 5\' 3"  (1.6 m)     Head Circumference --      Peak Flow --      Pain Score 10/07/19 1623 8     Pain Loc --      Pain Edu? --      Excl. in GC? --      Constitutional: Alert and oriented. Well appearing and in no acute distress. Eyes: Conjunctivae are normal. PERRL. EOMI. Head: Atraumatic. ENT:      Ears:        Nose: No congestion/rhinnorhea.      Mouth/Throat: Mucous membranes are moist.  Neck: No stridor.    Cardiovascular: Normal rate, regular rhythm. Normal S1 and S2.  Good peripheral circulation. Respiratory: Normal respiratory effort without tachypnea or retractions. Lungs CTAB. Good air entry to the bases with no decreased or absent breath sounds. Gastrointestinal: No visible abdominal wall findings.  Bowel sounds 4 quadrants. Soft to palpation all quadrants.  Patient does have suprapubic tenderness only.  No guarding or rigidity. No palpable masses. No distention. No CVA tenderness. Musculoskeletal: Full range of motion to all extremities. No gross deformities appreciated. Neurologic:  Normal speech and language. No gross focal neurologic deficits are appreciated.  Skin:  Skin is warm, dry and intact. No rash noted. Psychiatric: Mood and affect are normal. Speech and behavior are normal. Patient exhibits appropriate insight and judgement.   ____________________________________________   LABS (all labs ordered are listed, but only abnormal results are displayed)  Labs Reviewed  URINALYSIS, COMPLETE (UACMP) WITH MICROSCOPIC - Abnormal; Notable for the following components:      Result Value   Color, Urine YELLOW (*)    APPearance HAZY (*)    Hgb urine dipstick MODERATE (*)    Protein, ur 30 (*)    Leukocytes,Ua MODERATE (*)    RBC / HPF >50 (*)    WBC, UA >50 (*)    All other components within normal limits   ____________________________________________  EKG   ____________________________________________  RADIOLOGY I personally viewed and evaluated these images as part of my medical decision making, as well as reviewing the written report by the radiologist.  CT Renal Stone Study  Result Date: 10/07/2019 CLINICAL DATA:  Flank pain, hematuria EXAM: CT ABDOMEN AND PELVIS WITHOUT CONTRAST TECHNIQUE: Multidetector CT imaging of the abdomen and pelvis was performed  following the standard protocol without IV contrast. COMPARISON:  02/21/2010 FINDINGS: Lower chest: No acute abnormality. Hepatobiliary: Decreased attenuation of the hepatic parenchyma suggesting hepatic steatosis. No focal liver abnormality. Gallbladder appears unremarkable. No gallstone or biliary dilatation. Pancreas: Unremarkable. No pancreatic ductal dilatation or surrounding inflammatory changes. Spleen: Normal in size without focal abnormality. Adrenals/Urinary Tract: Unremarkable adrenal glands. Bilateral kidneys within normal limits. No renal calculus or hydronephrosis. Ureters are nondilated. No ureteral calculi. Urinary bladder is unremarkable for the degree of distension. Stomach/Bowel: Stomach is within normal limits. Appendix not clearly visualized. No pericecal inflammatory changes to suggest appendicitis. Prominent colonic diverticulosis. No evidence of bowel wall thickening, distention, or inflammatory changes. Vascular/Lymphatic: No significant vascular findings are present. No enlarged abdominal or pelvic lymph nodes. Reproductive: Status post hysterectomy. No adnexal masses. Other: No abdominal wall hernia or abnormality. No abdominopelvic ascites. Musculoskeletal: No acute or significant osseous findings. IMPRESSION: 1. No acute abdominopelvic findings. Specifically, no evidence of obstructive uropathy. 2. Colonic diverticulosis without  evidence for acute diverticulitis. 3. Hepatic steatosis. Electronically Signed   By: Duanne GuessNicholas  Plundo M.D.   On: 10/07/2019 18:07    ____________________________________________    PROCEDURES  Procedure(s) performed:    Procedures    Medications  cefTRIAXone (ROCEPHIN) injection 1 g (has no administration in time range)  cephALEXin (KEFLEX) capsule 500 mg (has no administration in time range)     ____________________________________________   INITIAL IMPRESSION / ASSESSMENT AND PLAN / ED COURSE  Pertinent labs & imaging results that  were available during my care of the patient were reviewed by me and considered in my medical decision making (see chart for details).  Review of the [place] CSRS was performed in accordance of the [facility] prior to dispensing any controlled drugs.           Patient's diagnosis is consistent with urinary tract infection.  Patient presented to emergency department with flank/back pain, urinary urgency, dysuria, polyuria and hematuria.  No history of nephrolithiasis but differential included pyelonephritis, nephrolithiasis, urinary tract infection.  CT scan reveals no nephrolithiasis or obstructive uropathy.  Patient had findings consistent with urinary tract infection which is consistent with symptoms.  No CVA tenderness at this time with low concern for pyelonephritis.  Patient will be treated with IM Rocephin, first dose of Keflex here in the emergency department and will be discharged with course of Keflex and Pyridium for symptom control.  Tylenol and/or Motrin for additional symptom relief at home.  Drink plenty of fluids.  Follow-up primary care as needed..  Patient is given ED precautions to return to the ED for any worsening or new symptoms.     ____________________________________________  FINAL CLINICAL IMPRESSION(S) / ED DIAGNOSES  Final diagnoses:  Lower urinary tract infectious disease      NEW MEDICATIONS STARTED DURING THIS VISIT:  ED Discharge Orders         Ordered    phenazopyridine (PYRIDIUM) 95 MG tablet  3 times daily PRN     10/07/19 1827    cephALEXin (KEFLEX) 500 MG capsule  4 times daily     10/07/19 1827              This chart was dictated using voice recognition software/Dragon. Despite best efforts to proofread, errors can occur which can change the meaning. Any change was purely unintentional.    Racheal PatchesCuthriell, Desani Sprung D, PA-C 10/07/19 1829    Sharman CheekStafford, Phillip, MD 10/07/19 [phone]

## 2020-02-01 ENCOUNTER — Ambulatory Visit: Payer: Self-pay | Attending: Internal Medicine

## 2020-02-01 ENCOUNTER — Other Ambulatory Visit: Payer: Self-pay

## 2020-02-01 DIAGNOSIS — Z23 Encounter for immunization: Secondary | ICD-10-CM

## 2020-02-01 NOTE — Progress Notes (Signed)
   Covid-19 Vaccination Clinic  Name:  GENIA PERIN    MRN: [id number] DOB: 03/09/1961  02/01/2020  Ms. Ciesla was observed post Covid-19 immunization for 15 minutes without incident. She was provided with Vaccine Information Sheet and instruction to access the V-Safe system.   Ms. Mccoy was instructed to call 911 with any severe reactions post vaccine: Marland Kitchen Difficulty breathing  . Swelling of face and throat  . A fast heartbeat  . A bad rash all over body  . Dizziness and weakness   Immunizations Administered    Name Date Dose VIS Date Route   Pfizer COVID-19 Vaccine 02/01/2020  8:55 AM 0.3 mL 10/02/2019 Intramuscular   Manufacturer: [employer], [employer]   Lot: [id number]   NDC: [address]-0

## 2020-02-09 ENCOUNTER — Other Ambulatory Visit: Payer: Self-pay | Admitting: Cardiovascular Disease

## 2020-02-09 NOTE — Telephone Encounter (Signed)
Please schedule appointment for refills. Thank you! 

## 2020-02-23 ENCOUNTER — Ambulatory Visit: Payer: Self-pay | Attending: Internal Medicine

## 2020-02-23 DIAGNOSIS — Z23 Encounter for immunization: Secondary | ICD-10-CM

## 2020-02-23 NOTE — Progress Notes (Signed)
   Covid-19 Vaccination Clinic  Name:  April Estrada    MRN: [id number] DOB: 1961/05/06  02/23/2020  April Estrada was observed post Covid-19 immunization for 15 minutes without incident. She was provided with Vaccine Information Sheet and instruction to access the V-Safe system.   April Estrada was instructed to call 911 with any severe reactions post vaccine: Marland Kitchen Difficulty breathing  . Swelling of face and throat  . A fast heartbeat  . A bad rash all over body  . Dizziness and weakness   Immunizations Administered    Name Date Dose VIS Date Route   Pfizer COVID-19 Vaccine 02/23/2020  1:26 PM 0.3 mL 12/16/2018 Intramuscular   Manufacturer: [employer], [employer]   Lot: [id number]   NDC: [address]-5

## 2020-03-05 ENCOUNTER — Other Ambulatory Visit: Payer: Self-pay | Admitting: Cardiovascular Disease

## 2020-03-09 ENCOUNTER — Ambulatory Visit: Payer: Self-pay | Admitting: Cardiovascular Disease

## 2020-03-23 ENCOUNTER — Other Ambulatory Visit: Payer: Self-pay | Admitting: Cardiovascular Disease

## 2020-03-24 NOTE — Telephone Encounter (Signed)
Pt overdue for 12 month f/u last seen 10/2017. Please contact pt for future appointment.

## 2020-04-08 NOTE — Telephone Encounter (Signed)
Patient returning call Patient has [employer] home and would like to talk with insurance before scheduling

## 2020-04-08 NOTE — Telephone Encounter (Signed)
Attempted to schedule no ans no vm  

## 2020-06-14 ENCOUNTER — Other Ambulatory Visit: Payer: Self-pay | Admitting: Family Medicine

## 2020-06-14 DIAGNOSIS — Z1231 Encounter for screening mammogram for malignant neoplasm of breast: Secondary | ICD-10-CM

## 2020-06-25 ENCOUNTER — Other Ambulatory Visit: Payer: Self-pay

## 2020-06-25 ENCOUNTER — Emergency Department: Payer: BLUE CROSS/BLUE SHIELD

## 2020-06-25 ENCOUNTER — Emergency Department
Admission: EM | Admit: 2020-06-25 | Discharge: 2020-06-25 | Disposition: A | Discharge status: Home / Self Care | Payer: BLUE CROSS/BLUE SHIELD | Attending: Emergency Medicine | Admitting: Emergency Medicine

## 2020-06-25 ENCOUNTER — Encounter: Payer: Self-pay | Admitting: Intensive Care

## 2020-06-25 DIAGNOSIS — E119 Type 2 diabetes mellitus without complications: Secondary | ICD-10-CM | POA: Diagnosis not present

## 2020-06-25 DIAGNOSIS — Z79899 Other long term (current) drug therapy: Secondary | ICD-10-CM | POA: Insufficient documentation

## 2020-06-25 DIAGNOSIS — M25561 Pain in right knee: Secondary | ICD-10-CM | POA: Insufficient documentation

## 2020-06-25 DIAGNOSIS — I10 Essential (primary) hypertension: Secondary | ICD-10-CM | POA: Diagnosis not present

## 2020-06-25 HISTORY — DX: Type 2 diabetes mellitus without complications: E11.9

## 2020-06-25 MED ORDER — MELOXICAM 15 MG PO TABS: 15.0000 mg | ORAL_TABLET | Freq: Every day | ORAL | 2 refills | Status: AC

## 2020-06-25 NOTE — ED Notes (Signed)
Pt with right knee and leg pain since 3 weeks ago. Pain has progressively gotten worse. Pt states right leg gave out last evening and she fell, but did not hit head or have any LOC

## 2020-06-25 NOTE — ED Triage Notes (Signed)
Patient c/o right knee pain X3 weeks. Denies injury.

## 2020-06-25 NOTE — Discharge Instructions (Signed)
Wear knee support and take medication as directed.  Follow-up PCP.

## 2020-06-25 NOTE — ED Provider Notes (Signed)
[facility] Emergency Department Provider Note   ____________________________________________   First MD Initiated Contact with Patient 06/25/20 0831     (approximate)  I have reviewed the triage vital signs and the nursing notes.   HISTORY  Chief Complaint Knee Pain (right)    HPI April Estrada is a 59 y.o. female patient complain of 3 weeks of nontraumatic right knee pain.  Patient the pain is worse in the popliteal area.  Patient stated pain radiates down to her ankle.  Patient states she was on her way to an urgent care clinic yesterday when the knee "gave out".  Patient rates pain as 6/10.  Describes the pain as "achy".  Pain increased with weightbearing.  No palliative measure for complaint.  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         Past Medical History:  Diagnosis Date  . Diabetes mellitus without complication (HCC)   . Diverticulitis   . Hypertension   . Sleep apnea     Patient Active Problem List   Diagnosis Date Noted  . Prediabetes 06/29/2015  . Edema 08/27/2012  . Obese 08/27/2012  . Palpitation 07/18/2012  . Tachycardia 07/18/2012  . Chest pain 07/18/2012    Past Surgical History:  Procedure Laterality Date  . CARDIAC CATHETERIZATION  2010   [facility]  . CESAREAN SECTION    . VAGINAL HYSTERECTOMY      Prior to Admission medications   Medication Sig Start Date End Date Taking? Authorizing Provider  atenolol (TENORMIN) 100 MG tablet Take 1 tablet (100 mg total) by mouth daily. 11/05/17   Antonieta Iba, MD  atenolol (TENORMIN) 100 MG tablet TAKE 1 TABLET BY MOUTH EVERY DAY 03/07/20   Antonieta Iba, MD  cephALEXin (KEFLEX) 500 MG capsule Take 1 capsule (500 mg total) by mouth 4 (four) times daily. 10/07/19   Cuthriell, Delorise Royals, PA-C  meloxicam (MOBIC) 15 MG tablet Take 1 tablet (15 mg total) by mouth daily. 06/25/20 06/25/21  Joni Reining, PA-C  phenazopyridine (PYRIDIUM) 95 MG tablet  Take 1 tablet (95 mg total) by mouth 3 (three) times daily as needed for pain. 10/07/19   Cuthriell, Delorise Royals, PA-C    Allergies Codeine  Family History  Problem Relation Age of Onset  . Hypertension Mother   . Hyperlipidemia Mother   . Hypertension Paternal Grandmother   . Hyperlipidemia Paternal Grandmother   . Heart disease Paternal Grandmother   . Heart attack Paternal Grandfather   . Hyperlipidemia Paternal Grandfather   . Hypertension Paternal Grandfather     Social History Social History   Tobacco Use  . Smoking status: Never Smoker  . Smokeless tobacco: Never Used  Substance Use Topics  . Alcohol use: No  . Drug use: No    Review of Systems Constitutional: No fever/chills Eyes: No visual changes. ENT: No sore throat. Cardiovascular: Denies chest pain. Respiratory: Denies shortness of breath. Gastrointestinal: No abdominal pain.  No nausea, no vomiting.  No diarrhea.  No constipation. Genitourinary: Negative for dysuria. Musculoskeletal: Right knee pain. Skin: Negative for rash. Neurological: Negative for headaches, focal weakness or numbness. Endocrine:  Diabetes and hypertension. Allergic/Immunilogical: Codeine  ____________________________________________   PHYSICAL EXAM:  VITAL SIGNS: ED Triage Vitals  Enc Vitals Group     BP 06/25/20 0742 (!) 179/87     Pulse Rate 06/25/20 0742 65     Resp 06/25/20 0742 16     Temp 06/25/20 0742  98.4 F (36.9 C)     Temp Source 06/25/20 0742 Oral     SpO2 06/25/20 0742 97 %     Weight 06/25/20 0739 247 lb (112 kg)     Height 06/25/20 0739 5\' 3"  (1.6 m)     Head Circumference --      Peak Flow --      Pain Score 06/25/20 0739 6     Pain Loc --      Pain Edu? --      Excl. in GC? --    Constitutional: Alert and oriented. Well appearing and in no acute distress. Cardiovascular: Normal rate, regular rhythm. Grossly normal heart sounds.  Good peripheral circulation.  Elevated blood pressure. Respiratory:  Normal respiratory effort.  No retractions. Lungs CTAB. Genitourinary: Deferred Musculoskeletal: Neurologic:  Normal speech and language. No gross focal neurologic deficits are appreciated. No gait instability. Skin:  Skin is warm, dry and intact. No rash noted.  No abrasion or ecchymosis. Psychiatric: Mood and affect are normal. Speech and behavior are normal.  ____________________________________________   LABS (all labs ordered are listed, but only abnormal results are displayed)  Labs Reviewed - No data to display ____________________________________________  EKG   ____________________________________________  RADIOLOGY  ED MD interpretation:    Official radiology report(s): DG Knee Complete 4 Views Right  Result Date: 06/25/2020 CLINICAL DATA:  Nontraumatic right popliteal pain for the past 3 weeks. No known injury. EXAM: RIGHT KNEE - COMPLETE 4+ VIEW COMPARISON:  None. FINDINGS: No evidence of fracture, dislocation, or joint effusion. No evidence of arthropathy or other focal bone abnormality. Soft tissues are unremarkable. IMPRESSION: Negative. Electronically Signed   By: 08/25/2020 M.D.   On: 06/25/2020 09:32    ____________________________________________   PROCEDURES  Procedure(s) performed (including Critical Care):  Procedures   ____________________________________________   INITIAL IMPRESSION / ASSESSMENT AND PLAN / ED COURSE  As part of my medical decision making, I reviewed the following data within the electronic MEDICAL RECORD NUMBER     Patient presents with 3 weeks of nonproductive knee pain.  Physical exam is grossly unremarkable.  Discussed x-ray findings showing joint space narrowing.  Patient placed in elastic knee support given a prescription for meloxicam.  Patient advised follow-up PCP.    April Estrada was evaluated in Emergency Department on 06/25/2020 for the symptoms described in the history of present illness. She was evaluated in the  context of the global COVID-19 pandemic, which necessitated consideration that the patient might be at risk for infection with the SARS-CoV-2 virus that causes COVID-19. Institutional protocols and algorithms that pertain to the evaluation of patients at risk for COVID-19 are in a state of rapid change based on information released by regulatory bodies including the CDC and federal and state organizations. These policies and algorithms were followed during the patient's care in the ED.       ____________________________________________   FINAL CLINICAL IMPRESSION(S) / ED DIAGNOSES  Final diagnoses:  Acute pain of right knee     ED Discharge Orders         Ordered    meloxicam (MOBIC) 15 MG tablet  Daily        06/25/20 1008           Note:  This document was prepared using Dragon voice recognition software and may include unintentional dictation errors.    Katielynn, Horan, PA-C 06/25/20 1011    08/25/20, MD 06/25/20 [phone]

## 2020-07-22 ENCOUNTER — Other Ambulatory Visit: Payer: Self-pay | Admitting: Nurse Practitioner

## 2020-07-22 DIAGNOSIS — U071 COVID-19: Secondary | ICD-10-CM

## 2020-07-22 NOTE — Progress Notes (Signed)
I connected by phone with April Estrada on 07/22/2020 at 1:25 PM to discuss the potential use of an new treatment for mild to moderate COVID-19 viral infection in non-hospitalized patients.  This patient is a 59 y.o. female that meets the FDA criteria for Emergency Use Authorization of casirivimab\imdevimab.  Has a (+) direct SARS-CoV-2 viral test result  Has mild or moderate COVID-19   Is ? 59 years of age and weighs ? 40 kg  Is NOT hospitalized due to COVID-19  Is NOT requiring oxygen therapy or requiring an increase in baseline oxygen flow rate due to COVID-19  Is within 10 days of symptom onset  Has at least one of the high risk factor(s) for progression to severe COVID-19 and/or hospitalization as defined in EUA.  Specific high risk criteria : BMI > 25, Diabetes and Cardiovascular disease or hypertension   I have spoken and communicated the following to the patient or parent/caregiver:  1. FDA has authorized the emergency use of casirivimab\imdevimab for the treatment of mild to moderate COVID-19 in adults and pediatric patients with positive results of direct SARS-CoV-2 viral testing who are 40 years of age and older weighing at least 40 kg, and who are at high risk for progressing to severe COVID-19 and/or hospitalization.  2. The significant known and potential risks and benefits of casirivimab\imdevimab, and the extent to which such potential risks and benefits are unknown.  3. Information on available alternative treatments and the risks and benefits of those alternatives, including clinical trials.  4. Patients treated with casirivimab\imdevimab should continue to self-isolate and use infection control measures (e.g., wear mask, isolate, social distance, avoid sharing personal items, clean and disinfect "high touch" surfaces, and frequent handwashing) according to CDC guidelines.   5. The patient or parent/caregiver has the option to accept or refuse casirivimab\imdevimab  .  After reviewing this information with the patient, the patient has agreed to receive one of the available covid 19 monoclonal antibodies and will be provided an appropriate fact sheet prior to infusion.[name], DNP, AGNP-C [phone] (Infusion Center Hotline)

## 2020-07-23 ENCOUNTER — Ambulatory Visit (HOSPITAL_COMMUNITY)
Admission: RE | Admit: 2020-07-23 | Discharge: 2020-07-23 | Disposition: A | Discharge status: Home / Self Care | Payer: BLUE CROSS/BLUE SHIELD | Source: Ambulatory Visit | Attending: Pulmonary Disease | Admitting: Pulmonary Disease

## 2020-07-23 DIAGNOSIS — U071 COVID-19: Secondary | ICD-10-CM | POA: Diagnosis present

## 2020-07-23 MED ORDER — EPINEPHRINE 0.3 MG/0.3ML IJ SOAJ: 0.3000 mg | Freq: Once | INTRAMUSCULAR | Status: DC | PRN

## 2020-07-23 MED ORDER — SODIUM CHLORIDE 0.9 % IV SOLN: INTRAVENOUS | Status: DC | PRN

## 2020-07-23 MED ORDER — SODIUM CHLORIDE 0.9 % IV SOLN
1200.0000 mg | Freq: Once | INTRAVENOUS | Status: AC
  Administered 2020-07-23: 1200 mg via INTRAVENOUS

## 2020-07-23 MED ORDER — ALBUTEROL SULFATE HFA 108 (90 BASE) MCG/ACT IN AERS: 2.0000 | INHALATION_SPRAY | Freq: Once | RESPIRATORY_TRACT | Status: DC | PRN

## 2020-07-23 MED ORDER — DIPHENHYDRAMINE HCL 50 MG/ML IJ SOLN: 50.0000 mg | Freq: Once | INTRAMUSCULAR | Status: DC | PRN

## 2020-07-23 MED ORDER — METHYLPREDNISOLONE SODIUM SUCC 125 MG IJ SOLR: 125.0000 mg | Freq: Once | INTRAMUSCULAR | Status: DC | PRN

## 2020-07-23 MED ORDER — FAMOTIDINE IN NACL 20-0.9 MG/50ML-% IV SOLN: 20.0000 mg | Freq: Once | INTRAVENOUS | Status: DC | PRN

## 2020-07-23 NOTE — Discharge Instructions (Signed)

## 2020-07-23 NOTE — Progress Notes (Signed)
  Diagnosis: COVID-19  Physician: Dr Wright  Procedure: Covid Infusion Clinic Med: casirivimab\imdevimab infusion - Provided patient with casirivimab\imdevimab fact sheet for patients, parents and caregivers prior to infusion.  Complications: No immediate complications noted.  Discharge: Discharged home   April Estrada L 07/23/2020  

## 2020-08-08 ENCOUNTER — Emergency Department
Admission: EM | Admit: 2020-08-08 | Discharge: 2020-08-08 | Disposition: A | Discharge status: Home / Self Care | Payer: BLUE CROSS/BLUE SHIELD | Attending: Emergency Medicine | Admitting: Emergency Medicine

## 2020-08-08 ENCOUNTER — Other Ambulatory Visit: Payer: Self-pay

## 2020-08-08 ENCOUNTER — Emergency Department: Payer: BLUE CROSS/BLUE SHIELD

## 2020-08-08 ENCOUNTER — Encounter: Payer: Self-pay | Admitting: Emergency Medicine

## 2020-08-08 DIAGNOSIS — E119 Type 2 diabetes mellitus without complications: Secondary | ICD-10-CM | POA: Insufficient documentation

## 2020-08-08 DIAGNOSIS — R059 Cough, unspecified: Secondary | ICD-10-CM | POA: Diagnosis present

## 2020-08-08 DIAGNOSIS — J209 Acute bronchitis, unspecified: Secondary | ICD-10-CM | POA: Insufficient documentation

## 2020-08-08 DIAGNOSIS — M546 Pain in thoracic spine: Secondary | ICD-10-CM | POA: Insufficient documentation

## 2020-08-08 DIAGNOSIS — I10 Essential (primary) hypertension: Secondary | ICD-10-CM | POA: Insufficient documentation

## 2020-08-08 MED ORDER — PREDNISONE 20 MG PO TABS: 40.0000 mg | ORAL_TABLET | Freq: Every day | ORAL | 0 refills | Status: DC

## 2020-08-08 MED ORDER — AZITHROMYCIN 500 MG PO TABS
500.0000 mg | ORAL_TABLET | Freq: Once | ORAL | Status: AC
  Administered 2020-08-08: 500 mg via ORAL
  Filled 2020-08-08: qty 1

## 2020-08-08 MED ORDER — HYDROCOD POLST-CPM POLST ER 10-8 MG/5ML PO SUER
5.0000 mL | Freq: Two times a day (BID) | ORAL | 0 refills | Status: DC | PRN
Start: 2020-08-08 — End: 2023-10-04

## 2020-08-08 MED ORDER — AZITHROMYCIN 250 MG PO TABS: ORAL_TABLET | ORAL | 0 refills | Status: DC

## 2020-08-08 MED ORDER — PREDNISONE 20 MG PO TABS
60.0000 mg | ORAL_TABLET | Freq: Once | ORAL | Status: AC
  Administered 2020-08-08: 60 mg via ORAL
  Filled 2020-08-08: qty 3

## 2020-08-08 MED ORDER — HYDROCODONE-ACETAMINOPHEN 5-325 MG PO TABS
1.0000 | ORAL_TABLET | ORAL | Status: AC
  Administered 2020-08-08: 1 via ORAL
  Filled 2020-08-08: qty 1

## 2020-08-08 MED ORDER — ALBUTEROL SULFATE HFA 108 (90 BASE) MCG/ACT IN AERS: 2.0000 | INHALATION_SPRAY | Freq: Four times a day (QID) | RESPIRATORY_TRACT | 2 refills | Status: AC | PRN

## 2020-08-08 NOTE — Discharge Instructions (Signed)
Please take medications as prescribed.  Apply heating pad to the mid back.  Avoid any lifting pushing or pulling.  Return to the ER for any fevers, difficulty breathing, chest pain, shortness of breath.  If continued pain in the mid back is not improving over the next week please follow-up with orthopedics

## 2020-08-08 NOTE — ED Notes (Signed)
Pt present to the ED for productive cough. Pt was dx with COVID approx a month ago and states the cough has not gone away. Pt states she think she may have cracked a rib r/t the coughing due to R sided pain she is having.

## 2020-08-08 NOTE — ED Provider Notes (Signed)
[facility] EMERGENCY DEPARTMENT Provider Note   CSN: [id number] Arrival date & time: 08/08/20  1522     History Chief Complaint  Patient presents with  . Cough    April Estrada is a 59 y.o. female presents today for evaluation of mid back pain.  Patient was diagnosed with Covid 2 weeks ago and had been recovering well.  She still had some residual dry cough.  No fevers.  Patient states she was driving home, felt as if she had a cough, was trying to hold on the cough and felt a pop in the lumbar back.  She denies any chest pain or shortness of breath.  No wheezing or fevers.  She is not taking any medications for her cough or pain.  HPI     Past Medical History:  Diagnosis Date  . Diabetes mellitus without complication (HCC)   . Diverticulitis   . Hypertension   . Sleep apnea     Patient Active Problem List   Diagnosis Date Noted  . Prediabetes 06/29/2015  . Edema 08/27/2012  . Obese 08/27/2012  . Palpitation 07/18/2012  . Tachycardia 07/18/2012  . Chest pain 07/18/2012    Past Surgical History:  Procedure Laterality Date  . CARDIAC CATHETERIZATION  2010   [facility]  . CESAREAN SECTION    . VAGINAL HYSTERECTOMY       OB History   No obstetric history on file.     Family History  Problem Relation Age of Onset  . Hypertension Mother   . Hyperlipidemia Mother   . Hypertension Paternal Grandmother   . Hyperlipidemia Paternal Grandmother   . Heart disease Paternal Grandmother   . Heart attack Paternal Grandfather   . Hyperlipidemia Paternal Grandfather   . Hypertension Paternal Grandfather     Social History   Tobacco Use  . Smoking status: Never Smoker  . Smokeless tobacco: Never Used  Substance Use Topics  . Alcohol use: No  . Drug use: No    Home Medications Prior to Admission medications   Medication Sig Start Date End Date Taking? Authorizing Provider  albuterol (VENTOLIN HFA) 108 (90 Base) MCG/ACT inhaler Inhale 2 puffs  into the lungs every 6 (six) hours as needed for wheezing or shortness of breath. 08/08/20   Evon Slack, PA-C  atenolol (TENORMIN) 100 MG tablet Take 1 tablet (100 mg total) by mouth daily. 11/05/17   Antonieta Iba, MD  atenolol (TENORMIN) 100 MG tablet TAKE 1 TABLET BY MOUTH EVERY DAY 03/07/20   Antonieta Iba, MD  azithromycin (ZITHROMAX Z-PAK) 250 MG tablet Take 1 tablet daily x4 days 08/08/20   Evon Slack, PA-C  cephALEXin (KEFLEX) 500 MG capsule Take 1 capsule (500 mg total) by mouth 4 (four) times daily. 10/07/19   Cuthriell, Delorise Royals, PA-C  chlorpheniramine-HYDROcodone (TUSSIONEX PENNKINETIC ER) 10-8 MG/5ML SUER Take 5 mLs by mouth every 12 (twelve) hours as needed for cough. 08/08/20   Evon Slack, PA-C  meloxicam (MOBIC) 15 MG tablet Take 1 tablet (15 mg total) by mouth daily. 06/25/20 06/25/21  Joni Reining, PA-C  phenazopyridine (PYRIDIUM) 95 MG tablet Take 1 tablet (95 mg total) by mouth 3 (three) times daily as needed for pain. 10/07/19   Cuthriell, Delorise Royals, PA-C  predniSONE (DELTASONE) 20 MG tablet Take 2 tablets (40 mg total) by mouth daily. 08/08/20   Evon Slack, PA-C    Allergies    Codeine  Review of Systems   Review  of Systems  HENT: Negative for sore throat.   Respiratory: Positive for cough. Negative for choking, chest tightness and shortness of breath.   Cardiovascular: Negative for chest pain.  Gastrointestinal: Negative for nausea and vomiting.  Musculoskeletal: Positive for back pain.  Skin: Negative for rash and wound.  Neurological: Negative for dizziness, light-headedness, numbness and headaches.    Physical Exam Updated Vital Signs BP (!) 157/86 (BP Location: Left Arm)   Pulse 79   Temp 98.9 F (37.2 C) (Oral)   Resp 17   Ht 5\' 3"  (1.6 m)   Wt 112 kg   SpO2 94%   BMI 43.75 kg/m   Physical Exam Constitutional:      Appearance: She is well-developed.  HENT:     Head: Normocephalic and atraumatic.  Eyes:      Conjunctiva/sclera: Conjunctivae normal.  Cardiovascular:     Rate and Rhythm: Normal rate.  Pulmonary:     Effort: Pulmonary effort is normal. No respiratory distress.     Breath sounds: Normal breath sounds. No wheezing or rales.  Abdominal:     General: There is no distension.     Tenderness: There is no abdominal tenderness. There is no guarding.  Musculoskeletal:        General: Normal range of motion.     Cervical back: Normal range of motion.     Comments: Thoracic spinous process tender on the mid thoracic spine with percussion.  No cervical or lumbar spinous process tenderness.  Skin:    General: Skin is warm.     Findings: No rash.  Neurological:     Mental Status: She is alert and oriented to person, place, and time.  Psychiatric:        Behavior: Behavior normal.        Thought Content: Thought content normal.     ED Results / Procedures / Treatments   Labs (all labs ordered are listed, but only abnormal results are displayed) Labs Reviewed - No data to display  EKG None  Radiology DG Chest 2 View  Result Date: 08/08/2020 CLINICAL DATA:  Cough, recent COVID-19 infection EXAM: CHEST - 2 VIEW COMPARISON:  11/03/2016 FINDINGS: The heart size and mediastinal contours are within normal limits. Atherosclerotic calcification of the aortic knob. Subtle interstitial prominence bilaterally, most pronounced within the right upper lobe and right lung base. No pleural effusion. No pneumothorax. The visualized skeletal structures are unremarkable. IMPRESSION: Subtle interstitial prominence bilaterally, most pronounced within the right upper lobe and right lung base. This may reflect areas of early edema versus atypical/viral infection. Electronically Signed   By: 11/05/2016 D.O.   On: 08/08/2020 15:56    Procedures Procedures (including critical care time)  Medications Ordered in ED Medications  predniSONE (DELTASONE) tablet 60 mg (60 mg Oral Given 08/08/20 2019)    azithromycin (ZITHROMAX) tablet 500 mg (500 mg Oral Given 08/08/20 2019)  HYDROcodone-acetaminophen (NORCO/VICODIN) 5-325 MG per tablet 1 tablet (1 tablet Oral Given 08/08/20 2019)    ED Course  I have reviewed the triage vital signs and the nursing notes.  Pertinent labs & imaging results that were available during my care of the patient were reviewed by me and considered in my medical decision making (see chart for details).    MDM Rules/Calculators/A&P                          59 year old female recovering from Covid well, has had persistent cough.  Today felt a pop in her back with a cough.  Vital signs stable.  Afebrile.  Chest x-ray shows viral inflammation, no rib fractures or pneumothorax.  Lateral view shows some spondylosis and possible acute compression fracture of the thoracic vertebral body when compared to previous lateral chest x-ray from 2018.  Today's x-ray appears to be a slight wedge deformity of the thoracic vertebrae.  Patient is given prescription for prednisone, Tussionex, albuterol.  will place on azithromycin.  We will have patient follow-up PCP and/or orthopedics if no improvement 1 week.  She understands signs symptoms return to the ER for.   Final Clinical Impression(s) / ED Diagnoses Final diagnoses:  Cough  Acute bronchitis, unspecified organism  Acute midline thoracic back pain    Rx / DC Orders ED Discharge Orders         Ordered    azithromycin (ZITHROMAX Z-PAK) 250 MG tablet        08/08/20 2019    predniSONE (DELTASONE) 20 MG tablet  Daily        08/08/20 2019    chlorpheniramine-HYDROcodone (TUSSIONEX PENNKINETIC ER) 10-8 MG/5ML SUER  Every 12 hours PRN        08/08/20 2019    albuterol (VENTOLIN HFA) 108 (90 Base) MCG/ACT inhaler  Every 6 hours PRN        08/08/20 2020           Evon Slack, PA-C 08/08/20 2031    Chesley Noon, MD 08/08/20 2034

## 2020-08-08 NOTE — ED Triage Notes (Signed)
Just recovering from covid.  States has had a strong cough and today while driving, patient coughed and felt something 'pop' to right ribs.  AAOx3.  Skin warm and dry. NAD.  No SOB/ DOE.

## 2020-09-11 ENCOUNTER — Other Ambulatory Visit: Payer: Self-pay | Admitting: Physician Assistant

## 2021-03-14 ENCOUNTER — Other Ambulatory Visit: Payer: Self-pay | Admitting: Family Medicine

## 2021-03-14 DIAGNOSIS — Z1231 Encounter for screening mammogram for malignant neoplasm of breast: Secondary | ICD-10-CM

## 2022-06-15 ENCOUNTER — Ambulatory Visit: Payer: Self-pay | Admitting: Physician Assistant

## 2022-06-15 NOTE — Progress Notes (Deleted)
New patient visit   Patient: April Estrada   DOB: 11-Dec-1960   61 y.o. Female  MRN: [id number] Visit Date: 06/15/2022  Today's healthcare provider: Alfredia Ferguson, PA-C   No chief complaint on file.  Subjective    April Estrada is a 61 y.o. female who presents today as a new patient to establish care.  HPI  ***  Past Medical History:  Diagnosis Date   Diabetes mellitus without complication (HCC)    Diverticulitis    Hypertension    Sleep apnea    Past Surgical History:  Procedure Laterality Date   CARDIAC CATHETERIZATION  2010   [facility]   CESAREAN SECTION     VAGINAL HYSTERECTOMY     Family Status  Relation Name Status   Mother  Alive       lung,uterine& stomach cancer   Father  Deceased       throat cancer   Sister  Alive   PGM  Deceased   PGF  Deceased   Sister  Alive   Sister  Alive   Family History  Problem Relation Age of Onset   Hypertension Mother    Hyperlipidemia Mother    Hypertension Paternal Grandmother    Hyperlipidemia Paternal Grandmother    Heart disease Paternal Grandmother    Heart attack Paternal Grandfather    Hyperlipidemia Paternal Grandfather    Hypertension Paternal Grandfather    Social History   Socioeconomic History   Marital status: Married    Spouse name: Not on file   Number of children: Not on file   Years of education: Not on file   Highest education level: Not on file  Occupational History   Not on file  Tobacco Use   Smoking status: Never   Smokeless tobacco: Never  Substance and Sexual Activity   Alcohol use: No   Drug use: No   Sexual activity: Not on file  Other Topics Concern   Not on file  Social History Narrative   Not on file   Social Determinants of Health   Financial Resource Strain: Not on file  Food Insecurity: Not on file  Transportation Needs: Not on file  Physical Activity: Not on file  Stress: Not on file  Social Connections: Not on file   Outpatient Medications Prior to Visit   Medication Sig   albuterol (VENTOLIN HFA) 108 (90 Base) MCG/ACT inhaler Inhale 2 puffs into the lungs every 6 (six) hours as needed for wheezing or shortness of breath.   atenolol (TENORMIN) 100 MG tablet Take 1 tablet (100 mg total) by mouth daily.   atenolol (TENORMIN) 100 MG tablet TAKE 1 TABLET BY MOUTH EVERY DAY   azithromycin (ZITHROMAX Z-PAK) 250 MG tablet Take 1 tablet daily x4 days   cephALEXin (KEFLEX) 500 MG capsule Take 1 capsule (500 mg total) by mouth 4 (four) times daily.   chlorpheniramine-HYDROcodone (TUSSIONEX PENNKINETIC ER) 10-8 MG/5ML SUER Take 5 mLs by mouth every 12 (twelve) hours as needed for cough.   phenazopyridine (PYRIDIUM) 95 MG tablet Take 1 tablet (95 mg total) by mouth 3 (three) times daily as needed for pain.   predniSONE (DELTASONE) 20 MG tablet Take 2 tablets (40 mg total) by mouth daily.   No facility-administered medications prior to visit.   Allergies  Allergen Reactions   Codeine     Sick on stomach  Rash     Immunization History  Administered Date(s) Administered   PFIZER(Purple Top)SARS-COV-2 Vaccination 02/01/2020, 02/23/2020  Health Maintenance  Topic Date Due   HIV Screening  Never done   Hepatitis C Screening  Never done   TETANUS/TDAP  Never done   PAP SMEAR-Modifier  Never done   COLONOSCOPY (Pts 45-39yrs Insurance coverage will need to be confirmed)  Never done   MAMMOGRAM  Never done   Zoster Vaccines- Shingrix (1 of 2) Never done   COVID-19 Vaccine (3 - Pfizer series) 04/19/2020   INFLUENZA VACCINE  05/22/2022   HPV VACCINES  Aged Out    Patient Care Team: Lorn Junes, FNP as PCP - General (Family Medicine)  Review of Systems  All other systems reviewed and are negative.   {Labs  Heme  Chem  Endocrine  Serology  Results Review (optional):23779}   Objective    There were no vitals taken for this visit. {Show previous vital signs (optional):23777}  Physical Exam ***  Depression Screen     No data  to display         No results found for any visits on 06/15/22.  Assessment & Plan     ***  No follow-ups on file.     {provider attestation***:1}   Alfredia Ferguson, PA-C  [facility] [phone] (phone) [phone] (fax)  [facility]

## 2022-07-09 ENCOUNTER — Other Ambulatory Visit: Payer: Self-pay | Admitting: Family Medicine

## 2022-07-09 DIAGNOSIS — Z1231 Encounter for screening mammogram for malignant neoplasm of breast: Secondary | ICD-10-CM

## 2022-07-30 ENCOUNTER — Ambulatory Visit: Payer: BLUE CROSS/BLUE SHIELD | Admitting: Nurse Practitioner

## 2022-08-08 ENCOUNTER — Ambulatory Visit
Admission: RE | Admit: 2022-08-08 | Discharge: 2022-08-08 | Disposition: A | Discharge status: Home / Self Care | Payer: BLUE CROSS/BLUE SHIELD | Source: Ambulatory Visit | Attending: Family Medicine | Admitting: Family Medicine

## 2022-08-08 DIAGNOSIS — Z1231 Encounter for screening mammogram for malignant neoplasm of breast: Secondary | ICD-10-CM | POA: Insufficient documentation

## 2022-11-27 DIAGNOSIS — R32 Unspecified urinary incontinence: Secondary | ICD-10-CM | POA: Diagnosis not present

## 2022-11-27 DIAGNOSIS — E119 Type 2 diabetes mellitus without complications: Secondary | ICD-10-CM | POA: Diagnosis not present

## 2023-01-25 DIAGNOSIS — Z0131 Encounter for examination of blood pressure with abnormal findings: Secondary | ICD-10-CM | POA: Diagnosis not present

## 2023-01-25 DIAGNOSIS — R911 Solitary pulmonary nodule: Secondary | ICD-10-CM | POA: Diagnosis not present

## 2023-01-25 DIAGNOSIS — Z1159 Encounter for screening for other viral diseases: Secondary | ICD-10-CM | POA: Diagnosis not present

## 2023-01-25 DIAGNOSIS — R809 Proteinuria, unspecified: Secondary | ICD-10-CM | POA: Diagnosis not present

## 2023-01-25 DIAGNOSIS — Z1389 Encounter for screening for other disorder: Secondary | ICD-10-CM | POA: Diagnosis not present

## 2023-01-25 DIAGNOSIS — E119 Type 2 diabetes mellitus without complications: Secondary | ICD-10-CM | POA: Diagnosis not present

## 2023-01-25 DIAGNOSIS — I10 Essential (primary) hypertension: Secondary | ICD-10-CM | POA: Diagnosis not present

## 2023-01-25 DIAGNOSIS — Z712 Person consulting for explanation of examination or test findings: Secondary | ICD-10-CM | POA: Diagnosis not present

## 2023-01-25 DIAGNOSIS — Z0130 Encounter for examination of blood pressure without abnormal findings: Secondary | ICD-10-CM | POA: Diagnosis not present

## 2023-02-01 ENCOUNTER — Other Ambulatory Visit: Payer: Self-pay | Admitting: Family Medicine

## 2023-02-01 DIAGNOSIS — R911 Solitary pulmonary nodule: Secondary | ICD-10-CM

## 2023-02-05 DIAGNOSIS — R310 Gross hematuria: Secondary | ICD-10-CM | POA: Diagnosis not present

## 2023-02-05 DIAGNOSIS — N3946 Mixed incontinence: Secondary | ICD-10-CM | POA: Diagnosis not present

## 2023-02-05 DIAGNOSIS — R300 Dysuria: Secondary | ICD-10-CM | POA: Diagnosis not present

## 2023-02-05 DIAGNOSIS — N952 Postmenopausal atrophic vaginitis: Secondary | ICD-10-CM | POA: Diagnosis not present

## 2023-03-29 DIAGNOSIS — R310 Gross hematuria: Secondary | ICD-10-CM | POA: Diagnosis not present

## 2023-05-09 DIAGNOSIS — Z712 Person consulting for explanation of examination or test findings: Secondary | ICD-10-CM | POA: Diagnosis not present

## 2023-05-09 DIAGNOSIS — Z0131 Encounter for examination of blood pressure with abnormal findings: Secondary | ICD-10-CM | POA: Diagnosis not present

## 2023-05-09 DIAGNOSIS — Z0130 Encounter for examination of blood pressure without abnormal findings: Secondary | ICD-10-CM | POA: Diagnosis not present

## 2023-05-09 DIAGNOSIS — B3731 Acute candidiasis of vulva and vagina: Secondary | ICD-10-CM | POA: Diagnosis not present

## 2023-05-09 DIAGNOSIS — Z1389 Encounter for screening for other disorder: Secondary | ICD-10-CM | POA: Diagnosis not present

## 2023-05-09 DIAGNOSIS — Z1331 Encounter for screening for depression: Secondary | ICD-10-CM | POA: Diagnosis not present

## 2023-09-03 DIAGNOSIS — H5213 Myopia, bilateral: Secondary | ICD-10-CM | POA: Diagnosis not present

## 2023-10-04 ENCOUNTER — Ambulatory Visit: Payer: BLUE CROSS/BLUE SHIELD | Attending: Cardiovascular Disease | Admitting: Cardiovascular Disease

## 2023-10-04 ENCOUNTER — Encounter: Payer: Self-pay | Admitting: Cardiovascular Disease

## 2023-10-04 VITALS — BP 126/80 | HR 75 | Ht 63.0 in | Wt 245.2 lb

## 2023-10-04 DIAGNOSIS — R079 Chest pain, unspecified: Secondary | ICD-10-CM | POA: Diagnosis not present

## 2023-10-04 DIAGNOSIS — R000 Tachycardia, unspecified: Secondary | ICD-10-CM

## 2023-10-04 DIAGNOSIS — R002 Palpitations: Secondary | ICD-10-CM

## 2023-10-04 MED ORDER — ATENOLOL 100 MG PO TABS: 100.0000 mg | ORAL_TABLET | Freq: Every day | ORAL | 3 refills | Status: DC

## 2023-10-04 NOTE — Patient Instructions (Addendum)
Medication Instructions:   atenolol 100 mg daily in early evening With extra 50 mg as needed for breakthrough palpitations  If you need a refill on your cardiac medications before your next appointment, please call your pharmacy.   Lab work: No new labs needed  Testing/Procedures: No new testing needed  Follow-Up: At [facility], you and your health needs are our priority.  As part of our continuing mission to provide you with exceptional heart care, we have created designated Provider Care Teams.  These Care Teams include your primary Cardiologist (physician) and Advanced Practice Providers (APPs -  Physician Assistants and Nurse Practitioners) who all work together to provide you with the care you need, when you need it.  You will need a follow up appointment as needed  Providers on your designated Care Team:   Nicolasa Ducking, NP Eula Listen, PA-C Cadence Fransico Michael, [place]  COVID-19 Vaccine Information can be found at: [email or web address] For questions related to vaccine distribution or appointments, please email [email or web address] or call [phone].

## 2023-10-04 NOTE — Progress Notes (Signed)
Cardiology Office Note  Date:  10/04/2023   ID:  April Estrada, DOB 06/29/1961, MRN [id number]  PCP:  Lorn Junes, FNP (Inactive)   Chief Complaint  Patient presents with   New Patient (Initial Visit)    Re-establish care for palpitations. Patient c/o shortness of breath, palpitations, racing heart beats, chest pain that comes and goes. Medications reviewed by the patient verbally.     HPI:  [name] is a 62 year old woman with prior history of  chest pain, abnormal stress test,  cardiac catheterization showing no significant coronary artery disease in 2010  who has been maintained on atenolol for many years,  tachycardia and palpitations predominantly at nighttime.  She presents by referral from April Estrada for consultation of her chest pain, palpitations She is a CNA  Previously seen by myself in 2019  Reports she has gained weight over the past year or 2 Has been having symptoms of palpitations at night, wakes up with panicking feeling Pin pricking feeling in her chest at the time  Having SOB on walking, stairs No regular exercise program, concerned it is her weight contributing to symptoms  Takes her atenolol 100 mg in the morning  CT scan abdomen pelvis December 2020 with no significant coronary calcification or aortic atherosclerosis  Lab work reviewed Total chol 146, LDL 62 A1C 7.3  EKG personally reviewed by myself on todays visit EKG Interpretation Date/Time:  Friday October 04 2023 08:55:50 EST Ventricular Rate:  75 PR Interval:  170 QRS Duration:  94 QT Interval:  404 QTC Calculation: 451 R Axis:   -46  Text Interpretation: Normal sinus rhythm Left axis deviation Minimal voltage criteria for LVH, may be normal variant ( Cornell product ) When compared with ECG of 08-Jul-2012 08:12, Incomplete right bundle branch block is no longer Present Confirmed by April Estrada [phone]) on 10/04/2023 8:58:20 AM   Other past medical history Cardiac  catheterization 05/19/2009 shows normal coronary arteries. 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   Previously  seen in the emergency room for chest pain. EKG at that time showed left axis deviation, normal sinus rhythm with no other significant EKG changes.  Chest x-ray was normal, cardiac enzymes normal, all other lab work normal  PMH:   has a past medical history of Diabetes mellitus without complication (HCC), Diverticulitis, Hypertension, and Sleep apnea.  PSH:    Past Surgical History:  Procedure Laterality Date   CARDIAC CATHETERIZATION  2010   [facility]   CESAREAN SECTION     VAGINAL HYSTERECTOMY      Current Outpatient Medications  Medication Sig Dispense Refill   amLODipine (NORVASC) 10 MG tablet Take 10 mg by mouth daily.     atenolol (TENORMIN) 100 MG tablet Take 1 tablet (100 mg total) by mouth daily. 90 tablet 3   atorvastatin (LIPITOR) 40 MG tablet Take 40 mg by mouth at bedtime.     losartan (COZAAR) 50 MG tablet Take 50 mg by mouth daily.     meloxicam (MOBIC) 15 MG tablet Take 15 mg by mouth daily.     metFORMIN (GLUCOPHAGE-XR) 500 MG 24 hr tablet Take 1,000 mg by mouth daily.     VESICARE 5 MG tablet Take 5 mg by mouth daily.     albuterol (VENTOLIN HFA) 108 (90 Base) MCG/ACT inhaler Inhale 2 puffs into the lungs every 6 (six) hours as needed for wheezing or shortness of breath. (Patient not taking: Reported on 10/04/2023) 8 g 2   phenazopyridine (  PYRIDIUM) 95 MG tablet Take 1 tablet (95 mg total) by mouth 3 (three) times daily as needed for pain. (Patient not taking: Reported on 10/04/2023) 6 tablet 0   No current facility-administered medications for this visit.    Allergies:   Codeine   Social History:  The patient  reports that she has never smoked. She has never used smokeless tobacco. She reports that she does not drink alcohol and does not use drugs.   Family History:   family history includes Heart attack in her paternal grandfather; Heart  attack (age of onset: 10) in her sister; Heart disease in her paternal grandmother; Hyperlipidemia in her mother, paternal grandfather, and paternal grandmother; Hypertension in her mother, paternal grandfather, and paternal grandmother; Lung cancer in her mother; Throat cancer in her father.    Review of Systems: Review of Systems  Constitutional: Negative.   HENT: Negative.    Respiratory: Negative.    Cardiovascular: Negative.   Gastrointestinal: Negative.   Musculoskeletal: Negative.   Neurological: Negative.   Psychiatric/Behavioral: Negative.    All other systems reviewed and are negative.    PHYSICAL EXAM: VS:  BP 126/80 (BP Location: Left Arm, Patient Position: Sitting, Cuff Size: Large)   Pulse 75   Ht 5\' 3"  (1.6 m)   Wt 245 lb 4 oz (111.2 kg)   SpO2 98%   BMI 43.44 kg/m  , BMI Body mass index is 43.44 kg/m. GEN: Well nourished, well developed, in no acute distress HEENT: normal Neck: no JVD, carotid bruits, or masses Cardiac: RRR; no murmurs, rubs, or gallops,no edema  Respiratory:  clear to auscultation bilaterally, normal work of breathing GI: soft, nontender, nondistended, + BS MS: no deformity or atrophy Skin: warm and dry, no rash Neuro:  Strength and sensation are intact Psych: euthymic mood, full affect   Recent Labs: No results found for requested labs within last 365 days.    Lipid Panel Lab Results  Component Value Date   CHOL 194 06/10/2014   HDL 64 06/10/2014   LDLCALC 106 (H) 06/10/2014   TRIG 122 06/10/2014      Wt Readings from Last 3 Encounters:  10/04/23 245 lb 4 oz (111.2 kg)  08/08/20 247 lb (112 kg)  06/25/20 247 lb (112 kg)     ASSESSMENT AND PLAN:  Problem List Items Addressed This Visit     Palpitation   Relevant Orders   EKG 12-Lead (Completed)   Tachycardia   Relevant Orders   EKG 12-Lead (Completed)   Chest pain - Primary   Relevant Orders   EKG 12-Lead (Completed)   Palpitations She reports history of sleep  apnea, currently not on CPAP (could not tolerate) Weight trending higher Recommend she move the atenolol 100 mg to the p.m. in effort to suppress palpitations at nighttime -If she develops symptoms in the daytime, recommend she take atenolol 50 mg in the morning For continued symptoms, could consider a Zio monitor  Sleep apnea Currently not on CPAP Reports difficulty tolerating in the past Started discussion concerning methods for weight reduction including low carbohydrate diet, exercise program  Essential hypertension Blood pressure is well controlled on today's visit. No changes made to the medications.  Hyperlipidemia Cholesterol is at goal on the current lipid regimen. No changes to the medications were made.  Prior CT scan reviewed from 2020 showing no significant coronary calcification or aortic atherosclerosis  Atypical chest pain Presenting occasionally at nighttime, atypical in nature Low risk, no further workup at this time  Signed, Dossie Arbour, M.D., Ph.D. [facility] [place], [place] [phone]

## 2024-01-31 DIAGNOSIS — Z0131 Encounter for examination of blood pressure with abnormal findings: Secondary | ICD-10-CM | POA: Diagnosis not present

## 2024-01-31 DIAGNOSIS — Z1389 Encounter for screening for other disorder: Secondary | ICD-10-CM | POA: Diagnosis not present

## 2024-01-31 DIAGNOSIS — B029 Zoster without complications: Secondary | ICD-10-CM | POA: Diagnosis not present

## 2024-02-12 ENCOUNTER — Other Ambulatory Visit: Payer: Self-pay

## 2024-02-12 ENCOUNTER — Emergency Department
Admission: EM | Admit: 2024-02-12 | Discharge: 2024-02-12 | Disposition: A | Discharge status: Home / Self Care | Attending: Emergency Medicine | Admitting: Emergency Medicine

## 2024-02-12 DIAGNOSIS — R21 Rash and other nonspecific skin eruption: Secondary | ICD-10-CM | POA: Insufficient documentation

## 2024-02-12 DIAGNOSIS — E119 Type 2 diabetes mellitus without complications: Secondary | ICD-10-CM | POA: Insufficient documentation

## 2024-02-12 DIAGNOSIS — I10 Essential (primary) hypertension: Secondary | ICD-10-CM | POA: Diagnosis not present

## 2024-02-12 LAB — CBC WITH DIFFERENTIAL/PLATELET
Abs Immature Granulocytes: 0.1 10*3/uL — ABNORMAL HIGH (ref 0.00–0.07)
Basophils Absolute: 0.1 10*3/uL (ref 0.0–0.1)
Basophils Relative: 1 %
Eosinophils Absolute: 0.7 10*3/uL — ABNORMAL HIGH (ref 0.0–0.5)
Eosinophils Relative: 7 %
HCT: 42.3 % (ref 36.0–46.0)
Hemoglobin: 14 g/dL (ref 12.0–15.0)
Immature Granulocytes: 1 %
Lymphocytes Relative: 24 %
Lymphs Abs: 2.4 10*3/uL (ref 0.7–4.0)
MCH: 30 pg (ref 26.0–34.0)
MCHC: 33.1 g/dL (ref 30.0–36.0)
MCV: 90.6 fL (ref 80.0–100.0)
Monocytes Absolute: 0.6 10*3/uL (ref 0.1–1.0)
Monocytes Relative: 6 %
Neutro Abs: 6.3 10*3/uL (ref 1.7–7.7)
Neutrophils Relative %: 61 %
Platelets: 221 10*3/uL (ref 150–400)
RBC: 4.67 MIL/uL (ref 3.87–5.11)
RDW: 14.2 % (ref 11.5–15.5)
WBC: 10 10*3/uL (ref 4.0–10.5)
nRBC: 0 % (ref 0.0–0.2)

## 2024-02-12 LAB — COMPREHENSIVE METABOLIC PANEL WITH GFR
ALT: 28 U/L (ref 0–44)
AST: 21 U/L (ref 15–41)
Albumin: 4 g/dL (ref 3.5–5.0)
Alkaline Phosphatase: 54 U/L (ref 38–126)
Anion gap: 10 (ref 5–15)
BUN: 16 mg/dL (ref 8–23)
CO2: 21 mmol/L — ABNORMAL LOW (ref 22–32)
Calcium: 8.8 mg/dL — ABNORMAL LOW (ref 8.9–10.3)
Chloride: 104 mmol/L (ref 98–111)
Creatinine, Ser: 0.72 mg/dL (ref 0.44–1.00)
GFR, Estimated: >60 mL/min (ref >60)
Glucose, Bld: 245 mg/dL — ABNORMAL HIGH (ref 70–99)
Potassium: 3.9 mmol/L (ref 3.5–5.1)
Sodium: 135 mmol/L (ref 135–145)
Total Bilirubin: 0.6 mg/dL (ref 0.0–1.2)
Total Protein: 7 g/dL (ref 6.5–8.1)

## 2024-02-12 LAB — SEDIMENTATION RATE: Sed Rate: 17 mm/h (ref 0–30)

## 2024-02-12 LAB — GROUP A STREP BY PCR: Group A Strep by PCR: NOT DETECTED

## 2024-02-12 MED ORDER — HYDROXYZINE HCL 10 MG PO TABS: 10.0000 mg | ORAL_TABLET | Freq: Three times a day (TID) | ORAL | 0 refills | Status: AC | PRN

## 2024-02-12 MED ORDER — METHYLPREDNISOLONE 4 MG PO TBPK: ORAL_TABLET | ORAL | 0 refills | Status: AC

## 2024-02-12 MED ORDER — DOXYCYCLINE HYCLATE 100 MG PO TABS: 100.0000 mg | ORAL_TABLET | Freq: Two times a day (BID) | ORAL | 0 refills | Status: AC

## 2024-02-12 MED ORDER — HYDROXYZINE HCL 25 MG PO TABS
25.0000 mg | ORAL_TABLET | Freq: Once | ORAL | Status: AC
  Administered 2024-02-12: 25 mg via ORAL
  Filled 2024-02-12: qty 1

## 2024-02-12 NOTE — ED Triage Notes (Signed)
 Pt presents to the ED POV from home with rash that started one month ago. Pt states that she went to her PCP for this and was told that she may have shingles. Pt was started on valacyclovir x10 days ago. Pt's rash does cross midline and is on both sides of her body including legs, torso, and arms.

## 2024-02-12 NOTE — ED Provider Notes (Signed)
 [facility] Provider Note    Event Date/Time   First MD Initiated Contact with Patient 02/12/24 [phone]     (approximate)   History   Rash   HPI  April Estrada is a 63 y.o. female history of hypertension, diabetes, rash for 10 days presents emergency department stating that the rash is worsening, now her eyes are injected, rashes all over her body and is very itchy.  No recent travel.  Has not been in the woods or had a tick bite.  No sore throat or fever.  Patient states she went to her primary care doctor who looked at her leg where it started and thought it was shingles.  Started her on valacyclovir, gabapentin and triamcinolone cream.  States none of these medicines have worked and she is actually getting worse.  States the rash is not painful.  Does feel like she is getting hot.  Rash started on the left lower leg and has moved up over her body      Physical Exam   Triage Vital Signs: ED Triage Vitals  Encounter Vitals Group     BP 02/12/24 0725 (!) 149/87     Systolic BP Percentile --      Diastolic BP Percentile --      Pulse Rate 02/12/24 0725 98     Resp 02/12/24 0725 18     Temp 02/12/24 0725 98.4 F (36.9 C)     Temp Source 02/12/24 0725 Oral     SpO2 02/12/24 0725 92 %     Weight 02/12/24 0722 252 lb (114.3 kg)     Height 02/12/24 0722 5\' 3"  (1.6 m)     Head Circumference --      Peak Flow --      Pain Score 02/12/24 0722 4     Pain Loc --      Pain Education --      Exclude from Growth Chart --     Most recent vital signs: Vitals:   02/12/24 0725  BP: (!) 149/87  Pulse: 98  Resp: 18  Temp: 98.4 F (36.9 C)  SpO2: 92%     General: Awake, no distress.   CV:  Good peripheral perfusion. regular rate and  rhythm Resp:  Normal effort.  Abd:  No distention.   Other:  Eyes with injected conjunctiva bilaterally, I do not see any drainage, face is flushed, left breast has a reddish maculopapular patch, arms have a macular papular  rash, leg has a disseminated red swollen area, no vesicles but does have some blisters noted,   ED Results / Procedures / Treatments   Labs (all labs ordered are listed, but only abnormal results are displayed) Labs Reviewed  CBC WITH DIFFERENTIAL/PLATELET - Abnormal; Notable for the following components:      Result Value   Eosinophils Absolute 0.7 (*)    Abs Immature Granulocytes 0.10 (*)    All other components within normal limits  COMPREHENSIVE METABOLIC PANEL WITH GFR - Abnormal; Notable for the following components:   CO2 21 (*)    Glucose, Bld 245 (*)    Calcium 8.8 (*)    All other components within normal limits  GROUP A STREP BY PCR  SEDIMENTATION RATE     EKG     RADIOLOGY     PROCEDURES:   Procedures Chief Complaint  Patient presents with   Rash      MEDICATIONS ORDERED IN ED: Medications  hydrOXYzine  (ATARAX )  tablet 25 mg (25 mg Oral Given 02/12/24 0756)     IMPRESSION / MDM / ASSESSMENT AND PLAN / ED COURSE  I reviewed the triage vital signs and the nursing notes.                              Differential diagnosis includes, but is not limited to, allergic reaction, strep rash, recommend spotted fever, Lyme's, vasculitis, Stevens-Johnson's, adult Kawasaki's  Patient's presentation is most consistent with acute illness / injury with system symptoms.   Patient has not had a tick bite and has not been outside so feel like [facility] spotted fever is less likely.  Also no pain with the rash and no oral involvement so feel that Stevens-Johnson's is also less likely  Due to patient being diabetic with a disseminated rash we will go ahead and do some basic labs.  Most likely will send the patient out on a prednisone  taper along with a antibiotic.  She was given Atarax  here in the ED for itching   Labs are reassuring.  Sed rate normal so do not feel there is a vasculitis.  Patient's glucose elevated but she states she has been running a little  high recently.  Will go ahead and start her on doxycycline  which would cover Lyme's, recommend spotted fever or a strep/staph infection on the legs.  Also a short course of methylprednisone.  Also prescription for Atarax  for itching.  I did encourage her to follow-up with dermatology.  Explained to her that between going to the PCP and then here if this is not clearing up that she should see a specialist.  She is in agreement treatment plan.  Discharged stable condition.   FINAL CLINICAL IMPRESSION(S) / ED DIAGNOSES   Final diagnoses:  Rash     Rx / DC Orders   ED Discharge Orders          Ordered    hydrOXYzine  (ATARAX ) 10 MG tablet  3 times daily PRN        02/12/24 0900    doxycycline  (VIBRA -TABS) 100 MG tablet  2 times daily        02/12/24 0900    methylPREDNISolone  (MEDROL  DOSEPAK) 4 MG TBPK tablet        02/12/24 0900             Note:  This document was prepared using Dragon voice recognition software and may include unintentional dictation errors.    Delsie Figures, PA-C 02/12/24 0901    Bryson Carbine, MD 02/12/24 [phone]

## 2024-02-12 NOTE — ED Notes (Signed)
 See triage note  Presents with rash  States is started on her left lower leg  Was dx'd with shingles  States now the rash is generalized  Fine red rash noted to abd,back and arms  Positive itching  No fever or sore throat

## 2024-02-14 DIAGNOSIS — Z0131 Encounter for examination of blood pressure with abnormal findings: Secondary | ICD-10-CM | POA: Diagnosis not present

## 2024-02-14 DIAGNOSIS — E785 Hyperlipidemia, unspecified: Secondary | ICD-10-CM | POA: Diagnosis not present

## 2024-02-14 DIAGNOSIS — R5383 Other fatigue: Secondary | ICD-10-CM | POA: Diagnosis not present

## 2024-02-14 DIAGNOSIS — R21 Rash and other nonspecific skin eruption: Secondary | ICD-10-CM | POA: Diagnosis not present

## 2024-02-14 DIAGNOSIS — E119 Type 2 diabetes mellitus without complications: Secondary | ICD-10-CM | POA: Diagnosis not present

## 2024-02-14 DIAGNOSIS — Z1389 Encounter for screening for other disorder: Secondary | ICD-10-CM | POA: Diagnosis not present

## 2024-02-14 DIAGNOSIS — I10 Essential (primary) hypertension: Secondary | ICD-10-CM | POA: Diagnosis not present

## 2024-02-26 LAB — COLOGUARD: COLOGUARD: NEGATIVE

## 2024-07-30 ENCOUNTER — Other Ambulatory Visit: Payer: Self-pay | Admitting: Cardiovascular Disease

## 2024-10-29 ENCOUNTER — Other Ambulatory Visit: Payer: Self-pay | Admitting: Cardiovascular Disease

## 2024-10-30 ENCOUNTER — Other Ambulatory Visit: Payer: Self-pay | Admitting: Cardiovascular Disease

## 2024-10-30 MED ORDER — ATENOLOL 100 MG PO TABS: 100.0000 mg | ORAL_TABLET | Freq: Every day | ORAL | 0 refills | Status: AC

## 2024-10-30 NOTE — Addendum Note (Signed)
 Addended by: Johnathan Tortorelli on: 10/30/2024 07:27 AM   Modules accepted: Orders
